# Patient Record
Sex: Female | Born: 1995 | Race: White | Hispanic: No | Marital: Single | Smoking: Former smoker
Health system: Southern US, Community
[De-identification: ages and names within clinical notes are randomized; demographics above are authoritative.]

## PROBLEM LIST (undated history)

## (undated) ENCOUNTER — Inpatient Hospital Stay (HOSPITAL_COMMUNITY): Payer: Self-pay

## (undated) ENCOUNTER — Inpatient Hospital Stay: Admission: EM | Payer: Self-pay | Source: Home / Self Care

## (undated) DIAGNOSIS — Z789 Other specified health status: Secondary | ICD-10-CM

## (undated) DIAGNOSIS — J45909 Unspecified asthma, uncomplicated: Secondary | ICD-10-CM

## (undated) DIAGNOSIS — D561 Beta thalassemia: Secondary | ICD-10-CM

## (undated) HISTORY — PX: NO PAST SURGERIES: SHX2092

---

## 2013-10-18 ENCOUNTER — Encounter: Payer: Self-pay | Admitting: Advanced Practice Midwife

## 2013-10-18 ENCOUNTER — Ambulatory Visit (INDEPENDENT_AMBULATORY_CARE_PROVIDER_SITE_OTHER): Payer: Medicaid Other | Admitting: Advanced Practice Midwife

## 2013-10-18 VITALS — BP 131/86 | Temp 98.1°F | Ht 64.5 in | Wt 181.0 lb

## 2013-10-18 DIAGNOSIS — I10 Essential (primary) hypertension: Secondary | ICD-10-CM

## 2013-10-18 DIAGNOSIS — K5900 Constipation, unspecified: Secondary | ICD-10-CM

## 2013-10-18 DIAGNOSIS — IMO0001 Reserved for inherently not codable concepts without codable children: Secondary | ICD-10-CM

## 2013-10-18 DIAGNOSIS — Z3400 Encounter for supervision of normal first pregnancy, unspecified trimester: Secondary | ICD-10-CM | POA: Insufficient documentation

## 2013-10-18 DIAGNOSIS — Z113 Encounter for screening for infections with a predominantly sexual mode of transmission: Secondary | ICD-10-CM

## 2013-10-18 DIAGNOSIS — Z3201 Encounter for pregnancy test, result positive: Secondary | ICD-10-CM

## 2013-10-18 LAB — POCT URINALYSIS DIPSTICK
Bilirubin, UA: NEGATIVE
Blood, UA: NEGATIVE
GLUCOSE UA: NEGATIVE
Ketones, UA: NEGATIVE
LEUKOCYTES UA: NEGATIVE
NITRITE UA: NEGATIVE
Protein, UA: NEGATIVE
Spec Grav, UA: 1.025
UROBILINOGEN UA: NEGATIVE
pH, UA: 5

## 2013-10-18 LAB — HIV ANTIBODY (ROUTINE TESTING W REFLEX): HIV: NONREACTIVE

## 2013-10-18 MED ORDER — NEXA PLUS 29-1.25-350 MG PO CAPS: 1.0000 | ORAL_CAPSULE | Freq: Every day | ORAL | Status: DC

## 2013-10-18 NOTE — Progress Notes (Signed)
Subjective:    Kristin Vang is being seen today for her first obstetrical visit.  This is not a planned pregnancy. She is at [redacted]w[redacted]d gestation. Her obstetrical history is significant for this is patients first pregnancy . Relationship with FOB: significant other, living together. Patient does intend to breast feed. Pregnancy history fully reviewed. Pulse: 130 Patient states she is having constipation. Patient states she was smoking Marijuana before she found out she was pregnant but that she stopped smoking as soon as she found out.  Menstrual History: OB History   Grav Para Term Preterm Abortions TAB SAB Ect Mult Living   1               Menarche age: 56  Patient's last menstrual period was 07/29/2013.    The following portions of the patient's history were reviewed and updated as appropriate: allergies, current medications, past family history, past medical history, past social history, past surgical history and problem list.  Review of Systems A comprehensive review of systems was negative except for: Gastrointestinal: positive for constipation    Objective:    BP 131/86  Temp(Src) 98.1 F (36.7 C)  Ht 5' 4.5" (1.638 m)  Wt 181 lb (82.101 kg)  BMI 30.60 kg/m2  LMP 07/29/2013  General Appearance:    Alert, cooperative, no distress, appears stated age  Head:    Normocephalic, without obvious abnormality, atraumatic  Eyes:    PERRL, conjunctiva/corneas clear, EOM's intact, fundi    benign, both eyes  Ears:    Normal TM's and external ear canals, both ears  Nose:   Nares normal, septum midline, mucosa normal, no drainage    or sinus tenderness  Throat:   Lips, mucosa, and tongue normal; teeth and gums normal  Neck:   Supple, symmetrical, trachea midline, no adenopathy;    thyroid:  no enlargement/tenderness/nodules; no carotid   bruit or JVD  Back:     Symmetric, no curvature, ROM normal, no CVA tenderness  Lungs:     Clear to auscultation bilaterally, respirations unlabored   Chest Wall:    No tenderness or deformity   Heart:    Regular rate and rhythm, S1 and S2 normal, no murmur, rub   or gallop     Abdomen:     Soft, non-tender, bowel sounds active all four quadrants,    no masses, no organomegaly        Extremities:   Extremities normal, atraumatic, no cyanosis or edema  Pulses:   2+ and symmetric all extremities  Skin:   Skin color, texture, turgor normal, no rashes or lesions  Lymph nodes:   Cervical, supraclavicular, and axillary nodes normal  Neurologic:   CNII-XII intact, normal strength, sensation and reflexes    throughout      Assessment:    Pregnancy at [redacted]w[redacted]d weeks  Patient Active Problem List   Diagnosis Date Noted  . Constipation 10/18/2013  . Supervision of normal first pregnancy 10/18/2013  . White coat hypertension 10/18/2013      Plan:    Initial labs drawn. Prenatal vitamins.  Counseling provided regarding continued use of seat belts, cessation of alcohol consumption, smoking or use of illicit drugs; infection precautions i.e., influenza/TDAP immunizations, toxoplasmosis,CMV, parvovirus, listeria and varicella; workplace safety, exercise during pregnancy; routine dental care, safe medications, sexual activity, hot tubs, saunas, pools, travel, caffeine use, fish and methlymercury, potential toxins, hair treatments, varicose veins Weight gain recommendations per IOM guidelines reviewed: underweight/BMI< 18.5--> gain 28 - 40 lbs; normal weight/BMI  18.5 - 24.9--> gain 25 - 35 lbs; overweight/BMI 25 - 29.9--> gain 15 - 25 lbs; obese/BMI >30->gain  11 - 20 lbs Problem list reviewed and updated. FIRST/CF mutation testing/NIPT/QUAD SCREEN discussed: offer NV. Role of ultrasound in pregnancy discussed; fetal survey: plan between 18-20 weeks. Amniocentesis discussed: not indicated. VBAC calculator score: VBAC consent form provided Follow up in 4 weeks. Reviewed methods to help prevent constipation. 80% of 40 min visit spent on  counseling and coordination of care.   Kristin Vang CNM

## 2013-10-19 LAB — OBSTETRIC PANEL
Antibody Screen: NEGATIVE
BASOS PCT: 0 % (ref 0–1)
Basophils Absolute: 0 10*3/uL (ref 0.0–0.1)
EOS ABS: 0.1 10*3/uL (ref 0.0–0.7)
EOS PCT: 1 % (ref 0–5)
HCT: 33.8 % — ABNORMAL LOW (ref 36.0–46.0)
Hemoglobin: 11.2 g/dL — ABNORMAL LOW (ref 12.0–15.0)
Hepatitis B Surface Ag: NEGATIVE
LYMPHS ABS: 2.3 10*3/uL (ref 0.7–4.0)
Lymphocytes Relative: 21 % (ref 12–46)
MCH: 21.7 pg — AB (ref 26.0–34.0)
MCHC: 33.1 g/dL (ref 30.0–36.0)
MCV: 65.4 fL — AB (ref 78.0–100.0)
MONOS PCT: 4 % (ref 3–12)
Monocytes Absolute: 0.4 10*3/uL (ref 0.1–1.0)
Neutro Abs: 8.2 10*3/uL — ABNORMAL HIGH (ref 1.7–7.7)
Neutrophils Relative %: 74 % (ref 43–77)
PLATELETS: 293 10*3/uL (ref 150–400)
RBC: 5.17 MIL/uL — AB (ref 3.87–5.11)
RDW: 15.6 % — ABNORMAL HIGH (ref 11.5–15.5)
RH TYPE: POSITIVE
Rubella: 1.56 Index — ABNORMAL HIGH (ref <0.90)
WBC: 11.1 10*3/uL — ABNORMAL HIGH (ref 4.0–10.5)

## 2013-10-19 LAB — VARICELLA ZOSTER ANTIBODY, IGG: Varicella IgG: 133.6 Index (ref <135.00)

## 2013-10-19 LAB — GC/CHLAMYDIA PROBE AMP
CT Probe RNA: NEGATIVE
GC Probe RNA: NEGATIVE

## 2013-10-19 LAB — CULTURE, OB URINE
Colony Count: NO GROWTH
ORGANISM ID, BACTERIA: NO GROWTH

## 2013-10-19 LAB — VITAMIN D 25 HYDROXY (VIT D DEFICIENCY, FRACTURES): Vit D, 25-Hydroxy: 24 ng/mL — ABNORMAL LOW (ref 30–89)

## 2013-10-21 LAB — HEMOGLOBINOPATHY EVALUATION
HGB A: 95.5 % — AB (ref 96.8–97.8)
Hemoglobin Other: 0 %
Hgb A2 Quant: 4.5 % — ABNORMAL HIGH (ref 2.2–3.2)
Hgb F Quant: 0 % (ref 0.0–2.0)
Hgb S Quant: 0 %

## 2013-11-04 ENCOUNTER — Encounter: Payer: Self-pay | Admitting: Advanced Practice Midwife

## 2013-11-04 DIAGNOSIS — Z3400 Encounter for supervision of normal first pregnancy, unspecified trimester: Secondary | ICD-10-CM | POA: Insufficient documentation

## 2013-11-15 ENCOUNTER — Ambulatory Visit (INDEPENDENT_AMBULATORY_CARE_PROVIDER_SITE_OTHER): Payer: Medicaid Other | Admitting: Advanced Practice Midwife

## 2013-11-15 VITALS — BP 125/87 | Temp 98.0°F | Wt 179.0 lb

## 2013-11-15 DIAGNOSIS — Z3400 Encounter for supervision of normal first pregnancy, unspecified trimester: Secondary | ICD-10-CM

## 2013-11-15 LAB — POCT URINALYSIS DIPSTICK
Bilirubin, UA: NEGATIVE
Blood, UA: NEGATIVE
Glucose, UA: NEGATIVE
KETONES UA: NEGATIVE
Nitrite, UA: NEGATIVE
Protein, UA: NEGATIVE
Spec Grav, UA: 1.015
UROBILINOGEN UA: NEGATIVE
pH, UA: 7

## 2013-11-15 NOTE — Progress Notes (Signed)
Subjective: Kristin Vang is a 18 y.o. at 15 weeks by LMP  Patient denies vaginal leaking of fluid or bleeding, denies contractions.  Reports positive fetal movment.  Denies concerns today. Patient denies any known thalassemia or family history.  Objective: Filed Vitals:   11/15/13 1359  BP: 125/87  Temp: 98 F (36.7 C)   160 FHR 1/2 U and SP Fundal Height Fetal Position NA  Assessment: Patient Active Problem List   Diagnosis Date Noted  . Supervision of normal first pregnancy 11/04/2013  . Constipation 10/18/2013  . Supervision of normal first pregnancy 10/18/2013  . White coat hypertension 10/18/2013  Potential beta thalassemia trait  Plan: Patient to return to clinic in 4 weeks Genetic Consult Reviewed warning signs in pregnancy. Patient to call with concerns PRN. Reviewed triage location. Reviewed vit D deficiency.  20 min spent with patient greater than 80% spent in counseling and coordination of care.   Mattthew Ziomek Wilson SingerWren CNM

## 2013-11-15 NOTE — Progress Notes (Signed)
Pulse: 118 Patient denies any concerns.  

## 2013-11-16 ENCOUNTER — Encounter (HOSPITAL_COMMUNITY): Payer: Self-pay | Admitting: Advanced Practice Midwife

## 2013-11-16 ENCOUNTER — Other Ambulatory Visit: Payer: Self-pay | Admitting: Advanced Practice Midwife

## 2013-11-16 DIAGNOSIS — Z3400 Encounter for supervision of normal first pregnancy, unspecified trimester: Secondary | ICD-10-CM | POA: Insufficient documentation

## 2013-11-16 LAB — AFP, QUAD SCREEN
AFP: 24.7 [IU]/mL
Age Alone: 1:1190 {titer}
CURR GEST AGE: 15.4 wks.days
Down Syndrome Scr Risk Est: 1:295 {titer}
HCG, Total: 45620 m[IU]/mL
INH: 269 pg/mL
INTERPRETATION-AFP: NEGATIVE
MOM FOR AFP: 0.97
MoM for INH: 1.65
MoM for hCG: 1.88
Open Spina bifida: NEGATIVE
Tri 18 Scr Risk Est: NEGATIVE
Trisomy 18 (Edward) Syndrome Interp.: 1:15000 {titer}
UE3 MOM: 0.53
uE3 Value: 0.2 ng/mL

## 2013-11-23 ENCOUNTER — Ambulatory Visit (HOSPITAL_COMMUNITY): Admission: RE | Admit: 2013-11-23 | Payer: Self-pay | Source: Ambulatory Visit

## 2013-12-05 ENCOUNTER — Other Ambulatory Visit: Payer: Self-pay | Admitting: *Deleted

## 2013-12-05 DIAGNOSIS — Z1389 Encounter for screening for other disorder: Secondary | ICD-10-CM

## 2013-12-13 ENCOUNTER — Other Ambulatory Visit: Payer: Self-pay

## 2013-12-13 ENCOUNTER — Encounter: Payer: Self-pay | Admitting: Advanced Practice Midwife

## 2013-12-13 ENCOUNTER — Ambulatory Visit (INDEPENDENT_AMBULATORY_CARE_PROVIDER_SITE_OTHER): Payer: Medicaid Other

## 2013-12-13 ENCOUNTER — Ambulatory Visit (INDEPENDENT_AMBULATORY_CARE_PROVIDER_SITE_OTHER): Payer: Medicaid Other | Admitting: Advanced Practice Midwife

## 2013-12-13 VITALS — BP 126/85 | HR 110 | Temp 97.8°F | Wt 180.0 lb

## 2013-12-13 DIAGNOSIS — O283 Abnormal ultrasonic finding on antenatal screening of mother: Secondary | ICD-10-CM | POA: Insufficient documentation

## 2013-12-13 DIAGNOSIS — Z1389 Encounter for screening for other disorder: Secondary | ICD-10-CM

## 2013-12-13 DIAGNOSIS — Z3400 Encounter for supervision of normal first pregnancy, unspecified trimester: Secondary | ICD-10-CM

## 2013-12-13 DIAGNOSIS — O289 Unspecified abnormal findings on antenatal screening of mother: Secondary | ICD-10-CM

## 2013-12-13 LAB — US OB COMP + 14 WK

## 2013-12-13 NOTE — Progress Notes (Addendum)
Subjective: Kristin Vang is a 18 y.o. at 19 weeks by LMP  Patient denies vaginal leaking of fluid or bleeding, denies contractions.  Reports positive fetal movment.  Denies concerns today. Had ultrasound today, expecting a girl. Planning to name her Kristin Vang.  Objective: Filed Vitals:   12/13/13 1343  BP: 126/85  Pulse: 110  Temp: 97.8 F (36.6 C)   150 FHR @U  Fundal Height   Assessment: Patient Active Problem List   Diagnosis Date Noted  . 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 Supervision of normal first pregnancy 11/04/2013  . Constipation 10/18/2013  . Supervision of normal first pregnancy 10/18/2013  . White coat hypertension 10/18/2013    Plan: Patient to return to clinic in 4 weeks Review US NV Sent patient for Genetic Consult, missed last appt (change in phone number). Reviewed traveling education.  Glucose test NV Reviewed warning signs in pregnancy. Patient to call with concerns PRN. Reviewed triage location.  15 min spent with patient greater than 80% spent in counseling and coordination of care.   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 MFM referral for US placed. MFM will try to get patient in after genetic consult visit tomorrow. Patient notified.

## 2013-12-13 NOTE — Addendum Note (Signed)
Addended by: Glendell DockerKNIGHT, Firman Petrow on: 12/13/2013 03:42 PM   Modules accepted: Orders

## 2013-12-13 NOTE — Addendum Note (Signed)
Addended byWilson Singer: Swayze Pries H on: 12/13/2013 04:00 PM   Modules accepted: Orders

## 2013-12-14 ENCOUNTER — Ambulatory Visit (HOSPITAL_COMMUNITY)
Admission: RE | Admit: 2013-12-14 | Discharge: 2013-12-14 | Disposition: A | Discharge status: Home / Self Care | Payer: Medicaid Other | Source: Ambulatory Visit | Attending: Advanced Practice Midwife | Admitting: Advanced Practice Midwife

## 2013-12-14 DIAGNOSIS — O283 Abnormal ultrasonic finding on antenatal screening of mother: Secondary | ICD-10-CM

## 2013-12-14 DIAGNOSIS — Z3400 Encounter for supervision of normal first pregnancy, unspecified trimester: Secondary | ICD-10-CM

## 2013-12-14 DIAGNOSIS — D7589 Other specified diseases of blood and blood-forming organs: Secondary | ICD-10-CM | POA: Insufficient documentation

## 2013-12-14 DIAGNOSIS — IMO0002 Reserved for concepts with insufficient information to code with codable children: Secondary | ICD-10-CM | POA: Insufficient documentation

## 2013-12-14 DIAGNOSIS — K5900 Constipation, unspecified: Secondary | ICD-10-CM

## 2013-12-14 DIAGNOSIS — IMO0001 Reserved for inherently not codable concepts without codable children: Secondary | ICD-10-CM

## 2013-12-14 LAB — CHROMOSOMES ANALYSIS FOR CF

## 2013-12-14 NOTE — Progress Notes (Signed)
Genetic Counseling  Visit Summary Note  Appointment Date: 12/14/2013 Referred By: Waynetta Pean, CNM  Date of Birth: 1995/12/31  Pregnancy history: G1P0 Estimated Date of Delivery: 05/05/14 Estimated Gestational Age: [redacted]w[redacted]d  I met with Ms. Kristin Kivettfor genetic counseling because she had an abnormal hemoglobin Vang.  She was accompanied to the visit by her best friend.  Ms. KLabreckhad routine screening for hemoglobinopathies via hemoglobin Vang and complete blood count.  This revealed a hemoglobin A of 95.5%, hemoglobin A2 of 4.5%, and hemoglobin F of 0%.  Her MCV value was 65.4.  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  We discussed that the thalassemias, collectively, are the most common human single gene disorders.  They are a heterogenous group of diseases of hemoglobin synthesis in which mutations reduce the synthesis or stability of either the alpha or beta globin chain to cause alpha-thalassemia or beta-thalassemia respectively.  The resulting imbalance in the ratio of the alpha chains to the beta chains causes the underlying pathology.  In beta-thalassemia, the alpha chains are produced in relative excess.  Therefore, because of the absence of the beta chains with which to form a tetramer, the excess alpha chains will precipitate in the cell damaging the membrane and leading to premature RBC destruction.  This results in hypochromic, microcytic anemia. Because the beta chain is not used for hemoglobin production during fetal life, the onset of symptoms in beta-thalassemia is not until a few months of life.  Elevation of hemoglobin A2 (alpha2/delta2) occurs uniquely in beta-thalassemia carriers, and this is what was found on Ms. Kristin Vang.  Continued production of  the delta chains allows for tetramer formation between the alpha and delta chains.  Beta-thalassemia produces a variable phenotype dependent upon the severity of the mutations.  Beta-thalassemia is inherited in an autosomal recessive manner.  For this reason we reviewed autosomal recessive inheritance.  We discussed that when one parent is a carrier for beta-thalassemia, if the other parent is a carrier for any beta chain hemoglobin variant (HgS, HgC, etc) there would be a risk for a clinically significant hemoglobinopathy in their children.  At this time, the father of the baby, Kristin Vang has not had hemoglobin Vang to determine if he carries a hemoglobin variant.  Ms. KOlsenstated that she will ask him to have testing performed.  We discussed that it was important that Kristin Vang a quantitative hemoglobin Vang to identify any hemoglobin variant that could interact with her beta-thalassemia mutation.   Ms. Kristin Vang counseled about the 1 in 4 (25%) chance with each pregnancy to have a child with a clinically significant hemoglobin variant if she and Kristin Vang both carriers.   There is also a 1 in 4 chance to have a child who is not a carrier, or affected; and a 1 in 2 chance to have a child who is a carrier.  We reviewed that early diagnosis of hemoglobinopathies is facilitated by newborn screening before the onset of symptoms.  We discussed the option of amniocentesis as a way to determine, prenatally, if a baby has a hemoglobinopathy or not, when both parents are known carriers.  This would only be an option if Kristin Vang testing and was found to be a carrier. She understands that a baby with a hemoglobinopathy would not appear different on a prenatal ultrasound.  The family histories were otherwise found to be noncontributory for birth defects, mental retardation, and known genetic conditions. Without further information regarding the provided family history, an accurate genetic  risk cannot be calculated. Further genetic counseling is warranted if more information is obtained.  Ms. Kristin Vang had her anatomy ultrasound last week at her primary obs office.  At that time it was noted that there was an echogenic bowel.  We discussed that the second trimester genetic sonogram is targeted at identifying features associated with aneuploidy.  It has evolved as a screening tool used to provide an individualized risk assessment for Down syndrome and other trisomies.  The ability of sonography to aid in the detection of aneuploidies relies on identification of both major structural anomalies and soft markers.  The patient was counseled that the latter term refers to findings that are often normal variants and do not cause any significant medical problems.  Nonetheless, these markers have a known association with aneuploidy.  The patient was counseled that bowel is defined as being echogenic if it is as bright as adjacent bone.  Echogenic bowel is present in ~0.6-2.4% of normal fetuses, but is associated with a six fold increase in risk for aneuploidy.  In addition, it is associated with a 2% risk for fetal cystic fibrosis, 3% risk for a congenital infection, and a 3% risk for a bleed.  Given Ms. Kristin Vang screen, we discussed that her a priori risk for fetal Down syndrome is 1 in 81.  Considering the ultrasound finding of echogenic bowel, the adjusted risk for fetal Down syndrome is approximately 1 in 50 (2%).   We reviewed chromosomes, nondisjunction, and the common features and variable prognosis of Down syndrome.  We also reviewed other aneuploidies including trisomies 63 and 30; however, Ms. Kristin Vang was counseled that these conditions are less likely given that the remainder of the fetal anatomy was normal by ultrasound.  We reviewed the option of noninvasive prenatal screening (NIPS).  She was counseled that screening tests are used to modify a patient's a priori risk for aneuploidy,  typically based on age.  This estimate provides a pregnancy specific risk assessment.  We reviewed the conditions for which the test screens, as well as the detection rates, and false positive rates.  She was also counseled regarding diagnostic testing via amniocentesis.  We reviewed the approximate 1 in 122-449 risk for complications for amniocentesis, including spontaneous pregnancy loss. After consideration of all the options, she elected to proceed with NIPS.  Results will be available in 8-10 business days. She also elected to have infectious titers drawn, as well as carrier testing for cystic fibrosis.  Ms. Kristin Vang denied exposure to environmental toxins or chemical agents. She denied the use of alcohol, tobacco or street drugs. She denied significant viral illnesses during the course of her pregnancy. Her medical and surgical histories were noncontributory.   I counseled Ms. Kristin Vang regarding the above risks and available options.  The approximate face-to-face time with the genetic counselor was 45 minutes.  Cam Hai, MS Certified Genetic Counselor

## 2013-12-15 ENCOUNTER — Other Ambulatory Visit: Payer: Self-pay

## 2013-12-15 ENCOUNTER — Other Ambulatory Visit: Payer: Self-pay | Admitting: Advanced Practice Midwife

## 2013-12-15 DIAGNOSIS — O352XX0 Maternal care for (suspected) hereditary disease in fetus, not applicable or unspecified: Secondary | ICD-10-CM

## 2013-12-15 DIAGNOSIS — O289 Unspecified abnormal findings on antenatal screening of mother: Secondary | ICD-10-CM

## 2013-12-15 DIAGNOSIS — Z3689 Encounter for other specified antenatal screening: Secondary | ICD-10-CM

## 2013-12-15 LAB — CMV ANTIBODY, IGG (EIA): CMV Ab - IgG: <0.2 U/mL (ref <0.60)

## 2013-12-15 LAB — CMV IGM: CMV IgM: <8 AU/mL (ref <30.00)

## 2013-12-15 LAB — TOXOPLASMA ANTIBODIES- IGG AND  IGM: Toxoplasma IgG Ratio: <3 IU/mL (ref <7.2)

## 2013-12-15 LAB — HSV(HERPES SMPLX)ABS-I+II(IGG+IGM)-BLD
HERPES SIMPLEX VRS I-IGM AB (EIA): 0.77 {index}
HSV 1 Glycoprotein G Ab, IgG: 0.23 IV
HSV 2 Glycoprotein G Ab, IgG: <0.1 IV

## 2013-12-16 ENCOUNTER — Encounter: Payer: Self-pay | Admitting: Advanced Practice Midwife

## 2013-12-16 LAB — US OB COMP + 14 WK

## 2013-12-17 LAB — PARVOVIRUS B19 ANTIBODY, IGG AND IGM
PAROVIRUS B19 IGM ABS: 0.1 {index} (ref <0.9)
Parovirus B19 IgG Abs: 0.2 index (ref <0.9)

## 2013-12-21 ENCOUNTER — Other Ambulatory Visit: Payer: Self-pay | Admitting: Advanced Practice Midwife

## 2013-12-21 ENCOUNTER — Ambulatory Visit (HOSPITAL_COMMUNITY)
Admission: RE | Admit: 2013-12-21 | Discharge: 2013-12-21 | Disposition: A | Discharge status: Home / Self Care | Payer: Medicaid Other | Source: Ambulatory Visit | Attending: Advanced Practice Midwife | Admitting: Advanced Practice Midwife

## 2013-12-21 DIAGNOSIS — O352XX0 Maternal care for (suspected) hereditary disease in fetus, not applicable or unspecified: Secondary | ICD-10-CM | POA: Insufficient documentation

## 2013-12-21 DIAGNOSIS — O358XX0 Maternal care for other (suspected) fetal abnormality and damage, not applicable or unspecified: Secondary | ICD-10-CM

## 2013-12-21 DIAGNOSIS — Z3689 Encounter for other specified antenatal screening: Secondary | ICD-10-CM | POA: Insufficient documentation

## 2013-12-21 DIAGNOSIS — O289 Unspecified abnormal findings on antenatal screening of mother: Secondary | ICD-10-CM | POA: Insufficient documentation

## 2013-12-22 ENCOUNTER — Telehealth (HOSPITAL_COMMUNITY): Payer: Self-pay | Admitting: Obstetrics and Gynecology

## 2013-12-22 ENCOUNTER — Encounter: Payer: Self-pay | Admitting: Advanced Practice Midwife

## 2013-12-22 NOTE — Telephone Encounter (Signed)
Called Kristin Vang to discuss her cell free fetal DNA test results.  Mrs. Kristin BendersCarrie Brensinger had [facility] testing through [facility].  Testing was offered because of an echogenic bowel.   The patient was identified by name and DOB.  We reviewed that these are within normal limits, showing a less than 1 in 10,000 risk for trisomies 21, 18 and 13, and monosomy X (Turner syndrome).  In addition, the risk for triploidy/vanishing twin and sex chromosome trisomies (47,XXX and 47,XXY) was also low risk. We reviewed that this testing identifies > 99% of pregnancies with trisomy 5921, trisomy 4313, sex chromosome trisomies (47,XXX and 47,XXY), and triploidy. The detection rate for trisomy 18 is 96%.  The detection rate for monosomy X is ~92%.  The false positive rate is <0.1% for all conditions. Testing was also consistent with female gender.  She understands that this testing does not identify all genetic conditions.  All questions were answered to her satisfaction, she was encouraged to call with additional questions or concerns.  We discussed that her CF testing was still pending and I would call her with that as soon as it was available.  Mady Gemmaaragh Conrad, MS Certified Genetic Counselor

## 2013-12-27 ENCOUNTER — Telehealth (HOSPITAL_COMMUNITY): Payer: Self-pay | Admitting: Obstetrics and Gynecology

## 2013-12-27 NOTE — Telephone Encounter (Signed)
Called Ms. Kristin Vang to review the results of her CF carrier testing.  We discussed that this did not show evidence of her being a carrier for CF.  She understands that carrier testing is able to detect the most common CF gene changes, and thus can reduce, but not eliminate the chance to be a carrier for cystic fibrosis.  All of her questions were answered.  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  She will follow up with me when that has been completed. Mady Gemmaaragh Conrad, MS Certified Genetic Counselor

## 2014-01-10 ENCOUNTER — Ambulatory Visit (INDEPENDENT_AMBULATORY_CARE_PROVIDER_SITE_OTHER): Payer: Medicaid Other | Admitting: Advanced Practice Midwife

## 2014-01-10 ENCOUNTER — Encounter: Payer: Self-pay | Admitting: Advanced Practice Midwife

## 2014-01-10 ENCOUNTER — Other Ambulatory Visit: Payer: Medicaid Other

## 2014-01-10 VITALS — BP 123/79 | HR 128 | Temp 98.3°F | Wt 181.0 lb

## 2014-01-10 DIAGNOSIS — O289 Unspecified abnormal findings on antenatal screening of mother: Secondary | ICD-10-CM

## 2014-01-10 DIAGNOSIS — O283 Abnormal ultrasonic finding on antenatal screening of mother: Secondary | ICD-10-CM

## 2014-01-10 DIAGNOSIS — Z3400 Encounter for supervision of normal first pregnancy, unspecified trimester: Secondary | ICD-10-CM

## 2014-01-10 LAB — CBC
HEMATOCRIT: 30.2 % — AB (ref 36.0–46.0)
Hemoglobin: 9.8 g/dL — ABNORMAL LOW (ref 12.0–15.0)
MCH: 22.1 pg — ABNORMAL LOW (ref 26.0–34.0)
MCHC: 32.5 g/dL (ref 30.0–36.0)
MCV: 68.2 fL — AB (ref 78.0–100.0)
Platelets: 282 10*3/uL (ref 150–400)
RBC: 4.43 MIL/uL (ref 3.87–5.11)
RDW: 16.6 % — ABNORMAL HIGH (ref 11.5–15.5)
WBC: 11 10*3/uL — AB (ref 4.0–10.5)

## 2014-01-10 NOTE — Progress Notes (Signed)
Subjective: Kristin Vang is a 18 y.o. at 23 weeks by LMP  Patient denies vaginal leaking of fluid or bleeding, denies contractions.  Reports positive fetal movment.  Denies concerns today.  Objective: Filed Vitals:   01/10/14 0941  BP: 123/79  Pulse: 128  Temp: 98.3 F (36.8 C)   150 FHR 23 Fundal Height Fetal Position unknown  Assessment: Patient Active Problem List   Diagnosis Date Noted  . 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 Supervision of normal first pregnancy 11/04/2013  . Constipation 10/18/2013  . Supervision of normal first pregnancy 10/18/2013  . White coat hypertension 10/18/2013    Plan: Patient to return to clinic in 4 weeks FOC to have hemoglobin electrophoresis, they are trying to get this done. Repeat US pending Reviewed warning signs in pregnancy. Patient to call with concerns PRN. Reviewed triage location. 2 hour GCT today, review NV   Alejandra Hunt Wilson SingerWren CNM

## 2014-01-10 NOTE — Addendum Note (Signed)
Addended by: Odessa FlemingBOHNE, Trusten Hume M on: 01/10/2014 05:08 PM   Modules accepted: Orders

## 2014-01-11 LAB — GLUCOSE TOLERANCE, 2 HOURS W/ 1HR
GLUCOSE: 158 mg/dL (ref 70–170)
Glucose, 2 hour: 120 mg/dL (ref 70–139)
Glucose, Fasting: 72 mg/dL (ref 70–99)

## 2014-01-11 LAB — HIV ANTIBODY (ROUTINE TESTING W REFLEX): HIV 1&2 Ab, 4th Generation: NONREACTIVE

## 2014-01-11 LAB — RPR

## 2014-01-13 ENCOUNTER — Telehealth: Payer: Self-pay | Admitting: *Deleted

## 2014-01-13 NOTE — Telephone Encounter (Signed)
Patient called regarding lab results. Results reviewed with Dory Horn, CNM. Patient notified of lab results, Patient notified that she should start an Iron rich diet such as green leafy vegetables, red meats, oatmeal and Iron rich cereals. Patient notified that Amy would like to have her come in for a Lab visit for Iron studies. Amy also spoke with Patient. Patient to be scheduled on Tuesday.

## 2014-01-17 ENCOUNTER — Other Ambulatory Visit: Payer: Self-pay | Admitting: Advanced Practice Midwife

## 2014-01-17 DIAGNOSIS — D649 Anemia, unspecified: Secondary | ICD-10-CM

## 2014-01-18 ENCOUNTER — Ambulatory Visit (HOSPITAL_COMMUNITY)
Admission: RE | Admit: 2014-01-18 | Discharge: 2014-01-18 | Disposition: A | Discharge status: Home / Self Care | Payer: Medicaid Other | Source: Ambulatory Visit | Attending: Advanced Practice Midwife | Admitting: Advanced Practice Midwife

## 2014-01-18 VITALS — BP 120/81 | HR 126 | Wt 182.2 lb

## 2014-01-18 DIAGNOSIS — O3500X0 Maternal care for (suspected) central nervous system malformation or damage in fetus, unspecified, not applicable or unspecified: Secondary | ICD-10-CM | POA: Insufficient documentation

## 2014-01-18 DIAGNOSIS — O3507X0 Maternal care for (suspected) central nervous system malformation or damage in fetus, microcephaly, not applicable or unspecified: Secondary | ICD-10-CM

## 2014-01-18 DIAGNOSIS — Z3689 Encounter for other specified antenatal screening: Secondary | ICD-10-CM | POA: Insufficient documentation

## 2014-01-18 DIAGNOSIS — O350XX0 Maternal care for (suspected) central nervous system malformation in fetus, not applicable or unspecified: Secondary | ICD-10-CM | POA: Insufficient documentation

## 2014-01-18 DIAGNOSIS — O358XX0 Maternal care for other (suspected) fetal abnormality and damage, not applicable or unspecified: Secondary | ICD-10-CM | POA: Insufficient documentation

## 2014-01-20 ENCOUNTER — Encounter: Payer: Self-pay | Admitting: Advanced Practice Midwife

## 2014-01-20 DIAGNOSIS — Q02 Microcephaly: Secondary | ICD-10-CM | POA: Insufficient documentation

## 2014-01-24 ENCOUNTER — Other Ambulatory Visit: Payer: Self-pay | Admitting: Advanced Practice Midwife

## 2014-01-24 ENCOUNTER — Other Ambulatory Visit: Payer: Medicaid Other

## 2014-01-24 DIAGNOSIS — D649 Anemia, unspecified: Secondary | ICD-10-CM

## 2014-01-24 LAB — IBC PANEL
%SAT: 16 % — AB (ref 20–55)
TIBC: 338 ug/dL (ref 250–470)
UIBC: 284 ug/dL (ref 125–400)

## 2014-01-24 LAB — IRON: IRON: 54 ug/dL (ref 42–145)

## 2014-01-25 LAB — FERRITIN: Ferritin: 29 ng/mL (ref 10–291)

## 2014-01-31 ENCOUNTER — Encounter: Payer: Self-pay | Admitting: Advanced Practice Midwife

## 2014-02-07 ENCOUNTER — Ambulatory Visit (INDEPENDENT_AMBULATORY_CARE_PROVIDER_SITE_OTHER): Payer: Medicaid Other | Admitting: Advanced Practice Midwife

## 2014-02-07 ENCOUNTER — Encounter: Payer: Self-pay | Admitting: Advanced Practice Midwife

## 2014-02-07 VITALS — BP 123/79 | HR 108 | Temp 98.4°F | Wt 184.0 lb

## 2014-02-07 DIAGNOSIS — O26899 Other specified pregnancy related conditions, unspecified trimester: Secondary | ICD-10-CM

## 2014-02-07 DIAGNOSIS — N949 Unspecified condition associated with female genital organs and menstrual cycle: Secondary | ICD-10-CM

## 2014-02-07 DIAGNOSIS — R102 Pelvic and perineal pain: Secondary | ICD-10-CM

## 2014-02-07 DIAGNOSIS — Z3400 Encounter for supervision of normal first pregnancy, unspecified trimester: Secondary | ICD-10-CM

## 2014-02-07 LAB — POCT URINALYSIS DIPSTICK
Bilirubin, UA: NEGATIVE
Blood, UA: NEGATIVE
Glucose, UA: NEGATIVE
Ketones, UA: NEGATIVE
LEUKOCYTES UA: NEGATIVE
NITRITE UA: NEGATIVE
PH UA: 6
Spec Grav, UA: 1.02
UROBILINOGEN UA: NEGATIVE

## 2014-02-07 NOTE — Progress Notes (Signed)
Subjective: Saddie BendersCarrie Cuellar is a 18 y.o. at 27 weeks by LMP  Patient denies vaginal leaking of fluid or bleeding, denies contractions.  Reports positive fetal movment.  Denies concerns today.  Objective: Filed Vitals:   02/07/14 0941  BP: 123/79  Pulse: 108  Temp: 98.4 F (36.9 C)   140 FHR 26 Fundal Height Fetal Position uncertain  Assessment: Patient Active Problem List   Diagnosis Date Noted  . Microcephaly 01/20/2014  . 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 Supervision of normal first pregnancy 11/04/2013  . Constipation 10/18/2013  . White coat hypertension 10/18/2013    Plan: Patient to return to clinic in 2 weeks Repeat US scheduled June 25th (review NV) Reviewed warning signs in pregnancy. Patient to call with concerns PRN. Reviewed triage location. Reviewed iron studies, borderline. Reviewed iron rich diet with the patient at this time.  Amy Wilson SingerWren CNM

## 2014-02-15 ENCOUNTER — Ambulatory Visit (HOSPITAL_COMMUNITY)
Admission: RE | Admit: 2014-02-15 | Discharge: 2014-02-15 | Disposition: A | Discharge status: Home / Self Care | Payer: Medicaid Other | Source: Ambulatory Visit | Attending: Advanced Practice Midwife | Admitting: Advanced Practice Midwife

## 2014-02-15 ENCOUNTER — Encounter (HOSPITAL_COMMUNITY): Payer: Self-pay

## 2014-02-15 VITALS — BP 121/76 | HR 125 | Wt 185.0 lb

## 2014-02-15 DIAGNOSIS — Z3689 Encounter for other specified antenatal screening: Secondary | ICD-10-CM | POA: Insufficient documentation

## 2014-02-15 DIAGNOSIS — O283 Abnormal ultrasonic finding on antenatal screening of mother: Secondary | ICD-10-CM

## 2014-02-15 DIAGNOSIS — IMO0001 Reserved for inherently not codable concepts without codable children: Secondary | ICD-10-CM

## 2014-02-15 DIAGNOSIS — O350XX0 Maternal care for (suspected) central nervous system malformation in fetus, not applicable or unspecified: Secondary | ICD-10-CM | POA: Insufficient documentation

## 2014-02-15 DIAGNOSIS — O3507X0 Maternal care for (suspected) central nervous system malformation or damage in fetus, microcephaly, not applicable or unspecified: Secondary | ICD-10-CM

## 2014-02-15 DIAGNOSIS — Q02 Microcephaly: Secondary | ICD-10-CM

## 2014-02-15 DIAGNOSIS — O3500X0 Maternal care for (suspected) central nervous system malformation or damage in fetus, unspecified, not applicable or unspecified: Secondary | ICD-10-CM | POA: Insufficient documentation

## 2014-02-15 NOTE — Progress Notes (Signed)
[facility] ultrasound  Indication: 18 yr old G1P0 at 5178w5d with previous finding of echogenic bowel and microcephaly for follow up ultrasound.  Findings: 1. Single intrauterine pregnancy. 2. Estimated fetal weight is in the 33rd%. The head circumference is in the <3rd%. 3. Anterior placenta without evidence of previa. 4. Normal amniotic fluid index. 5. Normal transabdominal cervical length. 6. The limited anatomy survey is normal.  Recommendations: 1. Overall appropriate fetal growth. 2. Microcephaly: - previously counseled - head circumference is at 2 SD below the mean - negative TORCH work up - normal cell free fetal DNA - discussed association with increased risk of learning disability - intracranial structure appear normal 3. Previous finding of echogenic bowel: - previously counseled - work up as above - cystic fibrosis screen negative - recommend follow fetal growth every 4 weeks  Eulis FosterKristen Quinn, MD

## 2014-02-21 ENCOUNTER — Encounter: Payer: Medicaid Other | Admitting: Advanced Practice Midwife

## 2014-02-27 ENCOUNTER — Ambulatory Visit (INDEPENDENT_AMBULATORY_CARE_PROVIDER_SITE_OTHER): Payer: Medicaid Other | Admitting: Obstetrics & Gynecology

## 2014-02-27 ENCOUNTER — Encounter: Payer: Self-pay | Admitting: Obstetrics & Gynecology

## 2014-02-27 VITALS — BP 126/75 | HR 115 | Temp 99.3°F | Wt 184.0 lb

## 2014-02-27 DIAGNOSIS — Z3403 Encounter for supervision of normal first pregnancy, third trimester: Secondary | ICD-10-CM

## 2014-02-27 DIAGNOSIS — Z3400 Encounter for supervision of normal first pregnancy, unspecified trimester: Secondary | ICD-10-CM

## 2014-02-27 LAB — POCT URINALYSIS DIPSTICK
Bilirubin, UA: NEGATIVE
Blood, UA: NEGATIVE
Glucose, UA: NEGATIVE
Ketones, UA: NEGATIVE
NITRITE UA: NEGATIVE
PH UA: 5.5
Spec Grav, UA: 1.025
UROBILINOGEN UA: 1

## 2014-02-27 NOTE — Patient Instructions (Signed)
Tetanus, Diphtheria, Pertussis (Tdap) Vaccine  What You Need to Know  WHY GET VACCINATED?  Tetanus, diphtheria and pertussis can be very serious diseases, even for adolescents and adults. Tdap vaccine can protect us from these diseases.  TETANUS (Lockjaw) causes painful muscle tightening and stiffness, usually all over the body.  · It can lead to tightening of muscles in the head and neck so you can't open your mouth, swallow, or sometimes even breathe. Tetanus kills about 1 out of 5 people who are infected.  DIPHTHERIA can cause a thick coating to form in the back of the throat.  · It can lead to breathing problems, paralysis, heart failure, and death.  PERTUSSIS (Whooping Cough) causes severe coughing spells, which can cause difficulty breathing, vomiting and disturbed sleep.  · It can also lead to weight loss, incontinence, and rib fractures. Up to 2 in 100 adolescents and 5 in 100 adults with pertussis are hospitalized or have complications, which could include pneumonia and death.  These diseases are caused by bacteria. Diphtheria and pertussis are spread from person to person through coughing or sneezing. Tetanus enters the body through cuts, scratches, or wounds.  Before vaccines, the [place] saw as many as 200,000 cases a year of diphtheria and pertussis, and hundreds of cases of tetanus. Since vaccination began, tetanus and diphtheria have dropped by about 99% and pertussis by about 80%.  TDAP VACCINE  Tdap vaccine can protect adolescents and adults from tetanus, diphtheria, and pertussis. One dose of Tdap is routinely given at age 11 or 12. People who did not get Tdap at that age should get it as soon as possible.  Tdap is especially important for health care professionals and anyone having close contact with a baby younger than 12 months.  Pregnant women should get a dose of Tdap during every pregnancy, to protect the newborn from pertussis. Infants are most at risk for severe, life-threatening  complications from pertussis.  A similar vaccine, called Td, protects from tetanus and diphtheria, but not pertussis. A Td booster should be given every 10 years. Tdap may be given as one of these boosters if you have not already gotten a dose. Tdap may also be given after a severe cut or burn to prevent tetanus infection.  Your doctor can give you more information.  Tdap may safely be given at the same time as other vaccines.  SOME PEOPLE SHOULD NOT GET THIS VACCINE  · If you ever had a life-threatening allergic reaction after a dose of any tetanus, diphtheria, or pertussis containing vaccine, OR if you have a severe allergy to any part of this vaccine, you should not get Tdap. Tell your doctor if you have any severe allergies.  · If you had a coma, or long or multiple seizures within 7 days after a childhood dose of DTP or DTaP, you should not get Tdap, unless a cause other than the vaccine was found. You can still get Td.  · Talk to your doctor if you:  ¨ have epilepsy or another nervous system problem,  ¨ had severe pain or swelling after any vaccine containing diphtheria, tetanus or pertussis,  ¨ ever had Guillain-Barré Syndrome (GBS),  ¨ aren't feeling well on the day the shot is scheduled.  RISKS OF A VACCINE REACTION  With any medicine, including vaccines, there is a chance of side effects. These are usually mild and go away on their own, but serious reactions are also possible.  Brief fainting spells can follow   a vaccination, leading to injuries from falling. Sitting or lying down for about 15 minutes can help prevent these. Tell your doctor if you feel dizzy or light-headed, or have vision changes or ringing in the ears.  Mild problems following Tdap (Did not interfere with activities)  · Pain where the shot was given (about 3 in 4 adolescents or 2 in 3 adults)  · Redness or swelling where the shot was given (about 1 person in 5)  · Mild fever of at least 100.4°F (up to about 1 in 25 adolescents or 1 in  100 adults)  · Headache (about 3 or 4 people in 10)  · Tiredness (about 1 person in 3 or 4)  · Nausea, vomiting, diarrhea, stomach ache (up to 1 in 4 adolescents or 1 in 10 adults)  · Chills, body aches, sore joints, rash, swollen glands (uncommon)  Moderate problems following Tdap (Interfered with activities, but did not require medical attention)  · Pain where the shot was given (about 1 in 5 adolescents or 1 in 100 adults)  · Redness or swelling where the shot was given (up to about 1 in 16 adolescents or 1 in 25 adults)  · Fever over 102°F (about 1 in 100 adolescents or 1 in 250 adults)  · Headache (about 3 in 20 adolescents or 1 in 10 adults)  · Nausea, vomiting, diarrhea, stomach ache (up to 1 or 3 people in 100)  · Swelling of the entire arm where the shot was given (up to about 3 in 100).  Severe problems following Tdap (Unable to perform usual activities, required medical attention)  · Swelling, severe pain, bleeding and redness in the arm where the shot was given (rare).  A severe allergic reaction could occur after any vaccine (estimated less than 1 in a million doses).  WHAT IF THERE IS A SERIOUS REACTION?  What should I look for?  · Look for anything that concerns you, such as signs of a severe allergic reaction, very high fever, or behavior changes.  Signs of a severe allergic reaction can include hives, swelling of the face and throat, difficulty breathing, a fast heartbeat, dizziness, and weakness. These would start a few minutes to a few hours after the vaccination.  What should I do?  · If you think it is a severe allergic reaction or other emergency that can't wait, call 9-1-1 or get the person to the nearest hospital. Otherwise, call your doctor.  · Afterward, the reaction should be reported to the "Vaccine Adverse Event Reporting System" (VAERS). Your doctor might file this report, or you can do it yourself through the VAERS web site at www.vaers.hhs.gov, or by calling 1-800-822-7967.  VAERS is  only for reporting reactions. They do not give medical advice.   THE NATIONAL VACCINE INJURY COMPENSATION PROGRAM  The National Vaccine Injury Compensation Program (VICP) is a federal program that was created to compensate people who may have been injured by certain vaccines.  Persons who believe they may have been injured by a vaccine can learn about the program and about filing a claim by calling 1-800-338-2382 or visiting the VICP website at www.hrsa.gov/vaccinecompensation.  HOW CAN I LEARN MORE?  · Ask your doctor.  · Call your local or state health department.  · Contact the Centers for Disease Control and Prevention (CDC):  ¨ Call 1-800-232-4636 or visit CDC's website at www.cdc.gov/vaccines.  CDC Tdap Vaccine VIS (01/01/12)  Document Released: 02/10/2012 Document Revised: 12/06/2012 Document Reviewed: 12/01/2012  ExitCare®   Patient Information ©2015 [employer]. This information is not intended to replace advice given to you by your health care provider. Make sure you discuss any questions you have with your health care provider.

## 2014-02-27 NOTE — Progress Notes (Signed)
Subjective:    Kristin Vang is a 18 y.o. female being seen today for her obstetrical visit. She is at 3287w3d gestation. Patient reports no complaints. Fetal movement: normal.  Problem List Items Addressed This Visit   Supervision of normal first pregnancy - Primary   Relevant Orders      POCT urinalysis dipstick     Patient Active Problem List   Diagnosis Date Noted  . Microcephaly 01/20/2014  . 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 Supervision of normal first pregnancy 11/04/2013  . Constipation 10/18/2013  . White coat hypertension 10/18/2013   Objective:    BP 126/75  Pulse 115  Temp(Src) 99.3 F (37.4 C)  Wt 83.462 kg (184 lb)  LMP 07/29/2013 FHT:  140 BPM  Uterine Size: size equals dates  Presentation: cephalic     Assessment:    Pregnancy @ 3987w3d weeks   Plan:     labs reviewed, problem list updated Encouraged FOB to have Hgb electrophoresis performed TDAP offered   Orders Placed This Encounter  Procedures  . POCT urinalysis dipstick   Follow up in 2 Weeks.

## 2014-03-13 ENCOUNTER — Encounter: Payer: Self-pay | Admitting: Obstetrics & Gynecology

## 2014-03-13 ENCOUNTER — Ambulatory Visit (INDEPENDENT_AMBULATORY_CARE_PROVIDER_SITE_OTHER): Payer: Medicaid Other | Admitting: Obstetrics & Gynecology

## 2014-03-13 VITALS — BP 109/76 | HR 99 | Temp 98.5°F | Wt 188.0 lb

## 2014-03-13 DIAGNOSIS — Z3400 Encounter for supervision of normal first pregnancy, unspecified trimester: Secondary | ICD-10-CM

## 2014-03-13 NOTE — Patient Instructions (Signed)
Contraception Choices  Birth control (contraception) is the use of any methods or devices to stop pregnancy from happening. Below are some methods to help avoid pregnancy.  HORMONAL BIRTH CONTROL  · A small tube put under the skin of the upper arm (implant). The tube can stay in place for 3 years. The implant must be taken out after 3 years.  · Shots given every 3 months.  · Pills taken every day.  · Patches that are changed once a week.  · A ring put into the vagina (vaginal ring). The ring is left in place for 3 weeks and removed for 1 week. Then, a new ring is put in the vagina.  · Emergency birth control pills taken after unprotected sex (intercourse).  BARRIER BIRTH CONTROL   · A thin covering worn on the penis (female condom) during sex.  · A soft, loose covering put into the vagina (female condom) before sex.  · A rubber bowl that sits over the cervix (diaphragm). The bowl must be made for you. The bowl is put into the vagina before sex. The bowl is left in place for 6 to 8 hours after sex.  · A small, soft cup that fits over the cervix (cervical cap). The cup must be made for you. The cup can be left in place for 48 hours after sex.  · A sponge that is put into the vagina before sex.  · A chemical that kills or stops sperm from getting into the cervix and uterus (spermicide). The chemical may be a cream, jelly, foam, or pill.  INTRAUTERINE (IUD) BIRTH CONTROL   · IUD birth control is a small, T-shaped piece of plastic. The plastic is put inside the uterus. There are 2 types of IUD:  ¨ Copper IUD. The IUD is covered in copper wire. The copper makes a fluid that kills sperm. It can stay in place for 10 years.  ¨ Hormone IUD. The hormone stops pregnancy from happening. It can stay in place for 5 years.  PERMANENT METHODS  · When the woman has her fallopian tubes sealed, tied, or blocked during surgery. This stops the egg from traveling to the uterus.  · The doctor places a small coil or insert into each fallopian  tube. This causes scar tissue to form and blocks the fallopian tubes.  · When the female has the tubes that carry sperm tied off (vasectomy).  NATURAL FAMILY PLANNING BIRTH CONTROL   · Natural family planning means not having sex or using barrier birth control on the days the woman could become pregnant.  · Use a calendar to keep track of the length of each period and know the days she can get pregnant.  · Avoid sex during ovulation.  · Use a thermometer to measure body temperature. Also watch for symptoms of ovulation.  · Time sex to be after the woman has ovulated.  Use condoms to help protect yourself against sexually transmitted infections (STIs). Do this no matter what type of birth control you use. Talk to your doctor about which type of birth control is best for you.  Document Released: 06/08/2009 Document Revised: 08/16/2013 Document Reviewed: 03/02/2013  ExitCare® Patient Information ©2015 [employer]. This information is not intended to replace advice given to you by your health care provider. Make sure you discuss any questions you have with your health care provider.

## 2014-03-13 NOTE — Progress Notes (Signed)
Subjective:    Kristin Vang is a 18 y.o. female being seen today for her obstetrical visit. She is at 7583w3d gestation. Patient reports no complaints. Fetal movement: normal.  Problem List Items Addressed This Visit     Unprioritized   Supervision of normal first pregnancy - Primary   Relevant Orders      POCT urinalysis dipstick     Patient Active Problem List   Diagnosis Date Noted  . Microcephaly 01/20/2014  . 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 Supervision of normal first pregnancy 11/04/2013  . Constipation 10/18/2013  . White coat hypertension 10/18/2013   Objective:    BP 109/76  Pulse 99  Temp(Src) 98.5 F (36.9 C)  Wt 85.276 kg (188 lb)  LMP 07/29/2013 FHT:  130 BPM  Uterine Size: size equals dates  Presentation: cephalic     Assessment:    Pregnancy @ 6783w3d weeks   Plan:     labs reviewed, problem list updated Consent signed. Pediatrician: discussed. Infant feeding: plans to breastfeed.  Cigarette smoking: never smoked. Orders Placed This Encounter  Procedures  . POCT urinalysis dipstick   No orders of the defined types were placed in this encounter.   Follow up in 2 Weeks.

## 2014-03-15 ENCOUNTER — Encounter (HOSPITAL_COMMUNITY): Payer: Self-pay

## 2014-03-15 ENCOUNTER — Ambulatory Visit (HOSPITAL_COMMUNITY)
Admission: RE | Admit: 2014-03-15 | Discharge: 2014-03-15 | Disposition: A | Discharge status: Home / Self Care | Payer: Medicaid Other | Source: Ambulatory Visit | Attending: Obstetrics & Gynecology | Admitting: Obstetrics & Gynecology

## 2014-03-15 DIAGNOSIS — O358XX0 Maternal care for other (suspected) fetal abnormality and damage, not applicable or unspecified: Secondary | ICD-10-CM | POA: Insufficient documentation

## 2014-03-15 DIAGNOSIS — O289 Unspecified abnormal findings on antenatal screening of mother: Secondary | ICD-10-CM | POA: Diagnosis not present

## 2014-03-15 DIAGNOSIS — Q02 Microcephaly: Secondary | ICD-10-CM

## 2014-03-15 DIAGNOSIS — Z3689 Encounter for other specified antenatal screening: Secondary | ICD-10-CM | POA: Insufficient documentation

## 2014-03-15 DIAGNOSIS — O283 Abnormal ultrasonic finding on antenatal screening of mother: Secondary | ICD-10-CM

## 2014-03-17 ENCOUNTER — Encounter (HOSPITAL_COMMUNITY): Payer: Self-pay | Admitting: *Deleted

## 2014-03-17 ENCOUNTER — Inpatient Hospital Stay (HOSPITAL_COMMUNITY)
Admission: AD | Admit: 2014-03-17 | Discharge: 2014-03-17 | Disposition: A | Discharge status: Home / Self Care | Payer: Medicaid Other | Source: Ambulatory Visit | Attending: Obstetrics & Gynecology | Admitting: Obstetrics & Gynecology

## 2014-03-17 DIAGNOSIS — O99891 Other specified diseases and conditions complicating pregnancy: Secondary | ICD-10-CM | POA: Diagnosis not present

## 2014-03-17 DIAGNOSIS — R109 Unspecified abdominal pain: Secondary | ICD-10-CM | POA: Insufficient documentation

## 2014-03-17 DIAGNOSIS — K5900 Constipation, unspecified: Secondary | ICD-10-CM

## 2014-03-17 DIAGNOSIS — Z87891 Personal history of nicotine dependence: Secondary | ICD-10-CM | POA: Diagnosis not present

## 2014-03-17 DIAGNOSIS — O9989 Other specified diseases and conditions complicating pregnancy, childbirth and the puerperium: Principal | ICD-10-CM

## 2014-03-17 NOTE — MAU Provider Note (Signed)
  History     CSN: [id number]  Arrival date and time: 03/17/14 1315   None     Chief Complaint  Patient presents with  . Abdominal Pain   HPI 18 year old female at 6137w0d gestation who presents with complaint of abdominal pain. Pain began 2 days ago on the left side. Pain is intermittent, dull, worsened by change in position. No vaginal bleeding, no contractions, no gush of fluid, positive fetal movement. No flank pain. Pain does not radiate. She has no shortness of breath or fever. No diarrhea. Patient has bowel movements every 3-4 days. She used to take colace but doesn't anymore.    History reviewed. No pertinent past medical history.  History reviewed. No pertinent past surgical history.  Family History  Problem Relation Age of Onset  . Hypertension Mother     History  Substance Use Topics  . Smoking status: Former Games developermoker  . Smokeless tobacco: Never Used  . Alcohol Use: No    Allergies: No Known Allergies  Prescriptions prior to admission  Medication Sig Dispense Refill  . Prenatal Vit-Fe Fumarate-FA (PRENATAL MULTIVITAMIN) TABS tablet Take 1 tablet by mouth daily at 12 noon.        Review of Systems  Constitutional: Negative for fever and chills.  HENT: Negative for hearing loss.   Eyes: Negative for blurred vision and photophobia.  Respiratory: Negative for cough and shortness of breath.   Cardiovascular: Negative for chest pain and palpitations.  Gastrointestinal: Negative for heartburn, nausea, vomiting, abdominal pain, diarrhea and constipation.  Genitourinary: Negative for dysuria and urgency.  Neurological: Negative for dizziness and headaches.  Psychiatric/Behavioral: Negative for depression and suicidal ideas.   Physical Exam   Blood pressure 110/83, pulse 18, temperature 98.3 F (36.8 C), resp. rate 18, last menstrual period 07/29/2013.  Physical Exam  Constitutional: She is oriented to person, place, and time. She appears well-developed and  well-nourished.  Sitting up in bed in no acute distress  Cardiovascular: Normal rate and regular rhythm.   Respiratory: Effort normal and breath sounds normal.  GI: Soft.  Mild tenderness to palpation of the left flank. No rebound or guarding  Genitourinary: Vagina normal.  Cervical exam closed, thick and high  Neurological: She is alert and oriented to person, place, and time.  Skin: Skin is warm.    MAU Course  Procedures  MDM Urinalysis to evaluate for UTI, kidney stone.  Assessment and Plan  18 year old G1 at 5237w0d with abdominal pain. Suspect constipation as source. Encouraged to use miralax BID. Could be round ligament pain. Encouraged pregnancy belt. If symptoms worsen, return for re-evaluation. Cervix is closed. Does not seem pregnancy related. Discharge home.  Markus JarvisStephens, Devin A 03/17/2014, 3:14 PM   I have seen and examined this patient and I agree with the above. FHR 130s, +accels, no decels; rare ctx. 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 Cam HaiSHAW, KIMBERLY CNM 3:44 PM 03/17/2014

## 2014-03-17 NOTE — Discharge Instructions (Signed)

## 2014-03-17 NOTE — MAU Note (Signed)
Pt presents to MAU with complaints of pain in the right and left side of her abdomen. Pain goes away with lying down. Denies any vaginal bleeding or LOF.

## 2014-03-20 LAB — POCT URINALYSIS DIPSTICK
Glucose, UA: NEGATIVE
Ketones, UA: NEGATIVE
Leukocytes, UA: NEGATIVE
NITRITE UA: NEGATIVE
PH UA: 6
Protein, UA: NEGATIVE
RBC UA: NEGATIVE
Spec Grav, UA: 1.015

## 2014-03-26 ENCOUNTER — Encounter (HOSPITAL_COMMUNITY): Payer: Self-pay

## 2014-03-26 ENCOUNTER — Inpatient Hospital Stay (HOSPITAL_COMMUNITY)
Admission: AD | Admit: 2014-03-26 | Discharge: 2014-03-26 | Disposition: A | Discharge status: Home / Self Care | Payer: Medicaid Other | Source: Ambulatory Visit | Attending: Obstetrics & Gynecology | Admitting: Obstetrics & Gynecology

## 2014-03-26 DIAGNOSIS — M545 Low back pain, unspecified: Secondary | ICD-10-CM | POA: Diagnosis present

## 2014-03-26 DIAGNOSIS — O99891 Other specified diseases and conditions complicating pregnancy: Secondary | ICD-10-CM | POA: Insufficient documentation

## 2014-03-26 DIAGNOSIS — M940 Chondrocostal junction syndrome [Tietze]: Secondary | ICD-10-CM | POA: Insufficient documentation

## 2014-03-26 DIAGNOSIS — Z87891 Personal history of nicotine dependence: Secondary | ICD-10-CM | POA: Insufficient documentation

## 2014-03-26 DIAGNOSIS — IMO0001 Reserved for inherently not codable concepts without codable children: Secondary | ICD-10-CM

## 2014-03-26 DIAGNOSIS — O9989 Other specified diseases and conditions complicating pregnancy, childbirth and the puerperium: Principal | ICD-10-CM

## 2014-03-26 HISTORY — DX: Other specified health status: Z78.9

## 2014-03-26 LAB — CBC WITH DIFFERENTIAL/PLATELET
BASOS ABS: 0 10*3/uL (ref 0.0–0.1)
BASOS PCT: 0 % (ref 0–1)
Band Neutrophils: 0 % (ref 0–10)
Blasts: 0 %
Eosinophils Absolute: 0 10*3/uL (ref 0.0–0.7)
Eosinophils Relative: 0 % (ref 0–5)
HCT: 31.9 % — ABNORMAL LOW (ref 36.0–46.0)
HEMOGLOBIN: 10.7 g/dL — AB (ref 12.0–15.0)
Lymphocytes Relative: 12 % (ref 12–46)
Lymphs Abs: 2.1 10*3/uL (ref 0.7–4.0)
MCH: 23.3 pg — AB (ref 26.0–34.0)
MCHC: 33.5 g/dL (ref 30.0–36.0)
MCV: 69.5 fL — ABNORMAL LOW (ref 78.0–100.0)
METAMYELOCYTES PCT: 0 %
MONOS PCT: 6 % (ref 3–12)
MYELOCYTES: 0 %
Monocytes Absolute: 1.1 10*3/uL — ABNORMAL HIGH (ref 0.1–1.0)
NEUTROS ABS: 14.5 10*3/uL — AB (ref 1.7–7.7)
Neutrophils Relative %: 82 % — ABNORMAL HIGH (ref 43–77)
Platelets: 245 10*3/uL (ref 150–400)
Promyelocytes Absolute: 0 %
RBC: 4.59 MIL/uL (ref 3.87–5.11)
RDW: 15.3 % (ref 11.5–15.5)
WBC: 17.7 10*3/uL — AB (ref 4.0–10.5)
nRBC: 0 /100 WBC

## 2014-03-26 LAB — URINALYSIS, ROUTINE W REFLEX MICROSCOPIC
Bilirubin Urine: NEGATIVE
GLUCOSE, UA: NEGATIVE mg/dL
HGB URINE DIPSTICK: NEGATIVE
Ketones, ur: 40 mg/dL — AB
Nitrite: NEGATIVE
PROTEIN: NEGATIVE mg/dL
Specific Gravity, Urine: <1.005 — ABNORMAL LOW (ref 1.005–1.030)
Urobilinogen, UA: 1 mg/dL (ref 0.0–1.0)
pH: 7 (ref 5.0–8.0)

## 2014-03-26 LAB — COMPREHENSIVE METABOLIC PANEL
ALK PHOS: 174 U/L — AB (ref 39–117)
ALT: 13 U/L (ref 0–35)
ANION GAP: 14 (ref 5–15)
AST: 17 U/L (ref 0–37)
Albumin: 3 g/dL — ABNORMAL LOW (ref 3.5–5.2)
BUN: 4 mg/dL — AB (ref 6–23)
CO2: 22 mEq/L (ref 19–32)
Calcium: 8.9 mg/dL (ref 8.4–10.5)
Chloride: 99 mEq/L (ref 96–112)
Creatinine, Ser: 0.59 mg/dL (ref 0.50–1.10)
GFR calc non Af Amer: >90 mL/min (ref >90)
GLUCOSE: 95 mg/dL (ref 70–99)
POTASSIUM: 4.4 meq/L (ref 3.7–5.3)
SODIUM: 135 meq/L — AB (ref 137–147)
TOTAL PROTEIN: 6.6 g/dL (ref 6.0–8.3)
Total Bilirubin: 0.3 mg/dL (ref 0.3–1.2)

## 2014-03-26 LAB — URINE MICROSCOPIC-ADD ON

## 2014-03-26 MED ORDER — CYCLOBENZAPRINE HCL 10 MG PO TABS
10.0000 mg | ORAL_TABLET | Freq: Once | ORAL | Status: AC
  Administered 2014-03-26: 10 mg via ORAL
  Filled 2014-03-26: qty 1

## 2014-03-26 MED ORDER — CYCLOBENZAPRINE HCL 10 MG PO TABS: 10.0000 mg | ORAL_TABLET | Freq: Three times a day (TID) | ORAL | Status: DC | PRN

## 2014-03-26 MED ORDER — ACETAMINOPHEN 325 MG PO TABS
650.0000 mg | ORAL_TABLET | Freq: Once | ORAL | Status: AC
  Administered 2014-03-26: 650 mg via ORAL
  Filled 2014-03-26: qty 2

## 2014-03-26 NOTE — MAU Note (Signed)
Pt states [facility]. Pain began at 0300 Saturday am. Was given two bags iv fluids, cath u/a, and was told pain was from dehydration. Pain starts at base of left ribcage wrapping around to back and up slightly towards chest. Pain is constant and hurts worse with deep inspiration.

## 2014-03-26 NOTE — MAU Provider Note (Signed)
CSN: [id number]     Arrival date & time 03/26/14  1727 History   None    Chief Complaint  Patient presents with  . Back Pain     (Consider location/radiation/quality/duration/timing/severity/associated sxs/prior Treatment) HPI Kristin Vang is a 18 y.o. G1P0 @ 5368w2d gestation who presents to the MAU with left side pain. She states she was evaluated here July 24 with same pain. [place] [place] and told she was dehydrated and given IV fluids. Today she continues to have the pain but rather than off and on has been persistent. She rates the pain as 10/10. The pain is worse with movement and with eating and drinking. The pain is better while taking a warm bath but then gets worse again. The patient states she wants to be sure that none of her organs are inflamed and really wants an ultrasound.  She has an appointment with DR. Tamela OddiJackson-Moore tomorrow.   Past Medical History  Diagnosis Date  . Medical history non-contributory    Past Surgical History  Procedure Laterality Date  . No past surgeries     Family History  Problem Relation Age of Onset  . Hypertension Mother    History  Substance Use Topics  . Smoking status: Former Games developermoker  . Smokeless tobacco: Never Used  . Alcohol Use: No   OB History   Grav Para Term Preterm Abortions TAB SAB Ect Mult Living   1              Review of Systems  Constitutional: Negative for fever and chills.  HENT: Negative.   Eyes: Negative for visual disturbance.  Respiratory: Negative for cough, chest tightness and shortness of breath.   Cardiovascular: Negative for chest pain and leg swelling.  Gastrointestinal: Positive for abdominal pain. Negative for nausea, vomiting, diarrhea and constipation.  Genitourinary: Positive for frequency. Negative for dysuria, decreased urine volume, vaginal bleeding and vaginal discharge.  Musculoskeletal: Positive for back pain.  Skin: Negative for rash.  Neurological: Negative for  light-headedness and headaches.  Psychiatric/Behavioral: Negative for confusion. The patient is not nervous/anxious.       Allergies  Review of patient's allergies indicates no known allergies.  Home Medications   Prior to Admission medications   Medication Sig Start Date End Date Taking? Authorizing Provider  acetaminophen (TYLENOL) 500 MG tablet Take 1,000 mg by mouth every 6 (six) hours as needed for headache.   Yes Historical Provider, MD  diphenhydrAMINE (BENADRYL) 25 MG tablet Take 25 mg by mouth daily as needed for sleep.   Yes Historical Provider, MD  Prenatal Vit-Fe Fumarate-FA (PRENATAL MULTIVITAMIN) TABS tablet Take 1 tablet by mouth daily.    Yes Historical Provider, MD  cyclobenzaprine (FLEXERIL) 10 MG tablet Take 1 tablet (10 mg total) by mouth 3 (three) times daily as needed for muscle spasms. 03/26/14   Wilmer FloorLisa A Leftwich-Kirby, CNM   BP 122/76  Pulse 109  Temp(Src) 98.9 F (37.2 C) (Oral)  Resp 18  Ht 5\' 4"  (1.626 m)  Wt 85.276 kg (188 lb)  BMI 32.25 kg/m2  LMP 07/29/2013 Physical Exam  Nursing note and vitals reviewed. Constitutional: She is oriented to person, place, and time. She appears well-developed and well-nourished.  HENT:  Head: Normocephalic.  Eyes: EOM are normal.  Neck: Neck supple.  Cardiovascular: Tachycardia present.   Pulmonary/Chest: Effort normal.  Abdominal: Soft. There is no tenderness.  Minimal tenderness with deep palpation LUQ and left anterior rib area   Genitourinary:  Dilation: Closed Effacement (%): Thick Station: -3 Exam by:: Damian Leavell, NP   Musculoskeletal: Normal range of motion.  Neurological: She is alert and oriented to person, place, and time. No cranial nerve deficit.  Skin: Skin is warm and dry.  Psychiatric: She has a normal mood and affect. Her behavior is normal.    ED Course  Procedures  Labs, PO fluids, tylenol PO, Flexeril PO EFM: baseline 150, reactive, occasional contraction Results for orders placed during the  hospital encounter of 03/26/14 (from the past 24 hour(s))  URINALYSIS, ROUTINE W REFLEX MICROSCOPIC     Status: Abnormal   Collection Time    03/26/14  5:45 PM      Result Value Ref Range   Color, Urine YELLOW  YELLOW   APPearance CLEAR  CLEAR   Specific Gravity, Urine <1.005 (*) 1.005 - 1.030   pH 7.0  5.0 - 8.0   Glucose, UA NEGATIVE  NEGATIVE mg/dL   Hgb urine dipstick NEGATIVE  NEGATIVE   Bilirubin Urine NEGATIVE  NEGATIVE   Ketones, ur 40 (*) NEGATIVE mg/dL   Protein, ur NEGATIVE  NEGATIVE mg/dL   Urobilinogen, UA 1.0  0.0 - 1.0 mg/dL   Nitrite NEGATIVE  NEGATIVE   Leukocytes, UA TRACE (*) NEGATIVE  URINE MICROSCOPIC-ADD ON     Status: Abnormal   Collection Time    03/26/14  5:45 PM      Result Value Ref Range   Squamous Epithelial / LPF MANY (*) RARE   WBC, UA 3-6  <3 WBC/hpf   Bacteria, UA MANY (*) RARE   Urine-Other MUCOUS PRESENT    CBC WITH DIFFERENTIAL     Status: Abnormal   Collection Time    03/26/14  7:15 PM      Result Value Ref Range   WBC 17.7 (*) 4.0 - 10.5 K/uL   RBC 4.59  3.87 - 5.11 MIL/uL   Hemoglobin 10.7 (*) 12.0 - 15.0 g/dL   HCT 16.1 (*) 09.6 - 04.5 %   MCV 69.5 (*) 78.0 - 100.0 fL   MCH 23.3 (*) 26.0 - 34.0 pg   MCHC 33.5  30.0 - 36.0 g/dL   RDW 40.9  81.1 - 91.4 %   Platelets 245  150 - 400 K/uL   Neutrophils Relative % 82 (*) 43 - 77 %   Lymphocytes Relative 12  12 - 46 %   Monocytes Relative 6  3 - 12 %   Eosinophils Relative 0  0 - 5 %   Basophils Relative 0  0 - 1 %   Band Neutrophils 0  0 - 10 %   Metamyelocytes Relative 0     Myelocytes 0     Promyelocytes Absolute 0     Blasts 0     nRBC 0  0 /100 WBC   Neutro Abs 14.5 (*) 1.7 - 7.7 K/uL   Lymphs Abs 2.1  0.7 - 4.0 K/uL   Monocytes Absolute 1.1 (*) 0.1 - 1.0 K/uL   Eosinophils Absolute 0.0  0.0 - 0.7 K/uL   Basophils Absolute 0.0  0.0 - 0.1 K/uL   RBC Morphology OVALOCYTES    COMPREHENSIVE METABOLIC PANEL     Status: Abnormal   Collection Time    03/26/14  7:15 PM       Result Value Ref Range   Sodium 135 (*) 137 - 147 mEq/L   Potassium 4.4  3.7 - 5.3 mEq/L   Chloride 99  96 - 112 mEq/L  CO2 22  19 - 32 mEq/L   Glucose, Bld 95  70 - 99 mg/dL   BUN 4 (*) 6 - 23 mg/dL   Creatinine, Ser 5.62  0.50 - 1.10 mg/dL   Calcium 8.9  8.4 - 13.0 mg/dL   Total Protein 6.6  6.0 - 8.3 g/dL   Albumin 3.0 (*) 3.5 - 5.2 g/dL   AST 17  0 - 37 U/L   ALT 13  0 - 35 U/L   Alkaline Phosphatase 174 (*) 39 - 117 U/L   Total Bilirubin 0.3  0.3 - 1.2 mg/dL   GFR calc non Af Amer >90  >90 mL/min   GFR calc Af Amer >90  >90 mL/min   Anion gap 14  5 - 15    Urine sent for culture.  MDM  18 y.o. female @ [redacted]w[redacted]d gestation with left side pain that increases with movement. Gave tylenol and patient states no pain relief. Will give Flexeril and reassess. Care turned over to [facility] @ 8pm. She will reassess the patient and disposition the patient.    A: 1. Acute costochondritis   2.  Musculoskeletal pain  P: D/C home Keep scheduled prenatal appointment tomorrow Flexeril 10 mg TID PRN Rest/ice/heat/Tylenol PRN Increase food/fluid intake.  Pt reports decreased appetite with pain.   Return to MAU as needed for emergencies  Sharen Counter Certified Nurse-Midwife

## 2014-03-26 NOTE — MAU Note (Signed)
Pt presents to MAU with complaints of pain in the lower right side of her back. Pt reports that [facility] and was given 2 bags of IV fluids for dehydration. States the pain is back and she isnt able to sleep.

## 2014-03-26 NOTE — Discharge Instructions (Signed)
Costochondritis Costochondritis, sometimes called Tietze syndrome, is a swelling and irritation (inflammation) of the tissue (cartilage) that connects your ribs with your breastbone (sternum). It causes pain in the chest and rib area. Costochondritis usually goes away on its own over time. It can take up to 6 weeks or longer to get better, especially if you are unable to limit your activities. CAUSES  Some cases of costochondritis have no known cause. Possible causes include:  Injury (trauma).  Exercise or activity such as lifting.  Severe coughing. SIGNS AND SYMPTOMS  Pain and tenderness in the chest and rib area.  Pain that gets worse when coughing or taking deep breaths.  Pain that gets worse with specific movements. DIAGNOSIS  Your health care provider will do a physical exam and ask about your symptoms. Chest X-rays or other tests may be done to rule out other problems. TREATMENT  Costochondritis usually goes away on its own over time. Your health care provider may prescribe medicine to help relieve pain. HOME CARE INSTRUCTIONS   Avoid exhausting physical activity. Try not to strain your ribs during normal activity. This would include any activities using chest, abdominal, and side muscles, especially if heavy weights are used.  Apply ice to the affected area for the first 2 days after the pain begins.  Put ice in a plastic bag.  Place a towel between your skin and the bag.  Leave the ice on for 20 minutes, 2-3 times a day.  Only take over-the-counter or prescription medicines as directed by your health care provider. SEEK MEDICAL CARE IF:  You have redness or swelling at the rib joints. These are signs of infection.  Your pain does not go away despite rest or medicine. SEEK IMMEDIATE MEDICAL CARE IF:   Your pain increases or you are very uncomfortable.  You have shortness of breath or difficulty breathing.  You cough up blood.  You have worse chest pains,  sweating, or vomiting.  You have a fever or persistent symptoms for more than 2-3 days.  You have a fever and your symptoms suddenly get worse. MAKE SURE YOU:   Understand these instructions.  Will watch your condition.  Will get help right away if you are not doing well or get worse. Document Released: 05/21/2005 Document Revised: 06/01/2013 Document Reviewed: 03/15/2013 [facility] Patient Information 2015 [place], [place]. This information is not intended to replace advice given to you by your health care provider. Make sure you discuss any questions you have with your health care provider. Musculoskeletal Pain Musculoskeletal pain is muscle and boney aches and pains. These pains can occur in any part of the body. Your caregiver may treat you without knowing the cause of the pain. They may treat you if blood or urine tests, X-rays, and other tests were normal.  CAUSES There is often not a definite cause or reason for these pains. These pains may be caused by a type of germ (virus). The discomfort may also come from overuse. Overuse includes working out too hard when your body is not fit. Boney aches also come from weather changes. Bone is sensitive to atmospheric pressure changes. HOME CARE INSTRUCTIONS  Ask when your test results will be ready. Make sure you get your test results. Only take over-the-counter or prescription medicines for pain, discomfort, or fever as directed by your caregiver. If you were given medications for your condition, do not drive, operate machinery or power tools, or sign legal documents for 24 hours. Do not drink alcohol. Do  not take sleeping pills or other medications that may interfere with treatment. Continue all activities unless the activities cause more pain. When the pain lessens, slowly resume normal activities. Gradually increase the intensity and duration of the activities or exercise. During periods of severe pain, bed rest may be helpful. Lay or sit in any  position that is comfortable. Putting ice on the injured area. Put ice in a bag. Place a towel between your skin and the bag. Leave the ice on for 15 to 20 minutes, 3 to 4 times a day. Follow up with your caregiver for continued problems and no reason can be found for the pain. If the pain becomes worse or does not go away, it may be necessary to repeat tests or do additional testing. Your caregiver may need to look further for a possible cause. SEEK IMMEDIATE MEDICAL CARE IF: You have pain that is getting worse and is not relieved by medications. You develop chest pain that is associated with shortness or breath, sweating, feeling sick to your stomach (nauseous), or throw up (vomit). Your pain becomes localized to the abdomen. You develop any new symptoms that seem different or that concern you. MAKE SURE YOU:  Understand these instructions. Will watch your condition. Will get help right away if you are not doing well or get worse. Document Released: 08/11/2005 Document Revised: 11/03/2011 Document Reviewed: 04/15/2013 [facility] Patient Information 2015 [place]. This information is not intended to replace advice given to you by your health care provider. Make sure you discuss any questions you have with your health care provider.

## 2014-03-27 ENCOUNTER — Ambulatory Visit (INDEPENDENT_AMBULATORY_CARE_PROVIDER_SITE_OTHER): Payer: Medicaid Other | Admitting: Obstetrics

## 2014-03-27 VITALS — Temp 98.2°F | Wt 184.0 lb

## 2014-03-27 DIAGNOSIS — Z3403 Encounter for supervision of normal first pregnancy, third trimester: Secondary | ICD-10-CM

## 2014-03-27 DIAGNOSIS — Z3400 Encounter for supervision of normal first pregnancy, unspecified trimester: Secondary | ICD-10-CM

## 2014-03-28 ENCOUNTER — Encounter (HOSPITAL_COMMUNITY): Payer: Self-pay | Admitting: *Deleted

## 2014-03-28 ENCOUNTER — Inpatient Hospital Stay (HOSPITAL_COMMUNITY)
Admission: AD | Admit: 2014-03-28 | Discharge: 2014-03-29 | Disposition: A | Discharge status: Home / Self Care | Payer: Medicaid Other | Source: Ambulatory Visit | Attending: Obstetrics | Admitting: Obstetrics

## 2014-03-28 ENCOUNTER — Encounter: Payer: Self-pay | Admitting: Obstetrics

## 2014-03-28 DIAGNOSIS — O26899 Other specified pregnancy related conditions, unspecified trimester: Secondary | ICD-10-CM

## 2014-03-28 DIAGNOSIS — O99891 Other specified diseases and conditions complicating pregnancy: Secondary | ICD-10-CM | POA: Insufficient documentation

## 2014-03-28 DIAGNOSIS — R1084 Generalized abdominal pain: Secondary | ICD-10-CM | POA: Diagnosis present

## 2014-03-28 DIAGNOSIS — Z8249 Family history of ischemic heart disease and other diseases of the circulatory system: Secondary | ICD-10-CM | POA: Diagnosis not present

## 2014-03-28 DIAGNOSIS — R109 Unspecified abdominal pain: Secondary | ICD-10-CM

## 2014-03-28 DIAGNOSIS — O9989 Other specified diseases and conditions complicating pregnancy, childbirth and the puerperium: Principal | ICD-10-CM

## 2014-03-28 DIAGNOSIS — Z87891 Personal history of nicotine dependence: Secondary | ICD-10-CM | POA: Diagnosis not present

## 2014-03-28 LAB — CULTURE, OB URINE

## 2014-03-28 NOTE — MAU Note (Signed)
Pt c/o contractions all day that have gotten more painful. States they are every couple of mins. Denies vag bleeding/LOF. +FM.

## 2014-03-28 NOTE — MAU Note (Signed)
Pt states she started having contractions about 1300. Contractions are about q4-5 as per pt.

## 2014-03-28 NOTE — Progress Notes (Signed)
Subjective:    Kristin Vang is a 18 y.o. female being seen today for her obstetrical visit. She is at 860w4d gestation. Patient reports no complaints. Fetal movement: normal.  Problem List Items Addressed This Visit   None     Patient Active Problem List   Diagnosis Date Noted  . Microcephaly 01/20/2014  . 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 Supervision of normal first pregnancy 11/04/2013  . Constipation 10/18/2013  . White coat hypertension 10/18/2013   Objective:    Temp(Src) 98.2 F (36.8 C)  Wt 184 lb (83.462 kg)  LMP 07/29/2013 FHT:  140 BPM  Uterine Size: size equals dates  Presentation: unsure     Assessment:    Pregnancy @ 10260w4d weeks   Plan:     labs reviewed, problem list updated Consent signed. GBS sent TDAP offered  Rhogam given for RH negative Pediatrician: discussed. Infant feeding: plans to breastfeed. Maternity leave: not discussed. Cigarette smoking: former smoker. No orders of the defined types were placed in this encounter.   No orders of the defined types were placed in this encounter.   Follow up in 1 Week.

## 2014-03-29 ENCOUNTER — Inpatient Hospital Stay (HOSPITAL_COMMUNITY): Payer: Medicaid Other

## 2014-03-29 DIAGNOSIS — O9989 Other specified diseases and conditions complicating pregnancy, childbirth and the puerperium: Secondary | ICD-10-CM

## 2014-03-29 DIAGNOSIS — R109 Unspecified abdominal pain: Secondary | ICD-10-CM

## 2014-03-29 LAB — URINALYSIS, ROUTINE W REFLEX MICROSCOPIC
BILIRUBIN URINE: NEGATIVE
GLUCOSE, UA: NEGATIVE mg/dL
HGB URINE DIPSTICK: NEGATIVE
KETONES UR: NEGATIVE mg/dL
Nitrite: NEGATIVE
PH: 7 (ref 5.0–8.0)
PROTEIN: NEGATIVE mg/dL
Specific Gravity, Urine: <1.005 — ABNORMAL LOW (ref 1.005–1.030)
Urobilinogen, UA: 0.2 mg/dL (ref 0.0–1.0)

## 2014-03-29 LAB — URINE MICROSCOPIC-ADD ON

## 2014-03-29 MED ORDER — HYDROCODONE-ACETAMINOPHEN 5-325 MG PO TABS
2.0000 | ORAL_TABLET | Freq: Once | ORAL | Status: DC
  Filled 2014-03-29: qty 2

## 2014-03-29 NOTE — MAU Provider Note (Signed)
History     CSN: [id number]  Arrival date and time: 03/28/14 2318   First Provider Initiated Contact with Patient 03/29/14 0002      Chief Complaint  Patient presents with  . Contractions   HPI Kristin Vang is a 18 y.o. G1P0 at 276w5d who presents today generalized abdominal pain. She states that the pain is all over her abdomen and back. She states that the pain is 10/10, and is constant. She denies any vaginal bleeding or LOF. She confirms fetal movement. She has not tried anything for the pain at this time. She thought that maybe she was having contractions, but she wasn't sure.    Past Medical History  Diagnosis Date  . Medical history non-contributory     Past Surgical History  Procedure Laterality Date  . No past surgeries      Family History  Problem Relation Age of Onset  . Hypertension Mother     History  Substance Use Topics  . Smoking status: Former Games developermoker  . Smokeless tobacco: Never Used  . Alcohol Use: No    Allergies: No Known Allergies  Prescriptions prior to admission  Medication Sig Dispense Refill  . acetaminophen (TYLENOL) 500 MG tablet Take 1,000 mg by mouth every 6 (six) hours as needed for headache.      . cyclobenzaprine (FLEXERIL) 10 MG tablet Take 1 tablet (10 mg total) by mouth 3 (three) times daily as needed for muscle spasms.  30 tablet  0  . diphenhydrAMINE (BENADRYL) 25 MG tablet Take 25 mg by mouth daily as needed for sleep.      . Prenatal Vit-Fe Fumarate-FA (PRENATAL MULTIVITAMIN) TABS tablet Take 1 tablet by mouth daily.         ROS Physical Exam   Blood pressure 120/80, pulse 140, temperature 97.7 F (36.5 C), temperature source Oral, resp. rate 20, height 5' 2.5" (1.588 m), weight 83.28 kg (183 lb 9.6 oz), last menstrual period 07/29/2013, SpO2 100.00%.  Physical Exam  Nursing note and vitals reviewed. Constitutional: She is oriented to person, place, and time. She appears well-developed and well-nourished. No distress.   Cardiovascular: Normal rate.   Respiratory: Effort normal.  GI: Soft. There is no tenderness. There is no rebound.  Neurological: She is alert and oriented to person, place, and time.  Skin: Skin is warm and dry.  Psychiatric: She has a normal mood and affect.   FHT 145, moderate with 15x15 accels for a brief  period initially then 145, moderate viability with no accels.  Toco: No UCs   BPP 8/8 MAU Course  Procedures Results for orders placed during the hospital encounter of 03/28/14 (from the past 24 hour(s))  URINALYSIS, ROUTINE W REFLEX MICROSCOPIC     Status: Abnormal   Collection Time    03/28/14 11:32 PM      Result Value Ref Range   Color, Urine YELLOW  YELLOW   APPearance CLEAR  CLEAR   Specific Gravity, Urine <1.005 (*) 1.005 - 1.030   pH 7.0  5.0 - 8.0   Glucose, UA NEGATIVE  NEGATIVE mg/dL   Hgb urine dipstick NEGATIVE  NEGATIVE   Bilirubin Urine NEGATIVE  NEGATIVE   Ketones, ur NEGATIVE  NEGATIVE mg/dL   Protein, ur NEGATIVE  NEGATIVE mg/dL   Urobilinogen, UA 0.2  0.0 - 1.0 mg/dL   Nitrite NEGATIVE  NEGATIVE   Leukocytes, UA TRACE (*) NEGATIVE  URINE MICROSCOPIC-ADD ON     Status: Abnormal   Collection Time  03/28/14 11:32 PM      Result Value Ref Range   Squamous Epithelial / LPF RARE  RARE   WBC, UA 3-6  <3 WBC/hpf   RBC / HPF 0-2  <3 RBC/hpf   Bacteria, UA FEW (*) RARE     Assessment and Plan   1. Abdominal pain during pregnancy    Third trimester precautions Return to MAU as needed  Follow-up Information   Follow up with HARPER,CHARLES A, MD. (As scheduled)    Specialty:  Obstetrics and Gynecology   Contact information:   [address] [place] [address] [phone]        Tawnya Crook 03/29/2014, 12:32 AM

## 2014-03-29 NOTE — Discharge Instructions (Signed)

## 2014-04-03 ENCOUNTER — Ambulatory Visit (INDEPENDENT_AMBULATORY_CARE_PROVIDER_SITE_OTHER): Payer: Medicaid Other | Admitting: Obstetrics

## 2014-04-03 ENCOUNTER — Encounter: Payer: Self-pay | Admitting: Obstetrics

## 2014-04-03 VITALS — BP 124/81 | HR 115 | Temp 98.1°F | Wt 187.0 lb

## 2014-04-03 DIAGNOSIS — Z3403 Encounter for supervision of normal first pregnancy, third trimester: Secondary | ICD-10-CM

## 2014-04-03 DIAGNOSIS — Z3400 Encounter for supervision of normal first pregnancy, unspecified trimester: Secondary | ICD-10-CM

## 2014-04-03 NOTE — Progress Notes (Signed)
Subjective:    Kristin Vang is a 18 y.o. female being seen today for her obstetrical visit. She is at 4871w3d gestation. Patient reports no complaints. Fetal movement: normal.  Problem List Items Addressed This Visit   Supervision of normal first pregnancy - Primary   Relevant Orders      POCT urinalysis dipstick      Strep B DNA probe     Patient Active Problem List   Diagnosis Date Noted  . Microcephaly 01/20/2014  . 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 Supervision of normal first pregnancy 11/04/2013  . Constipation 10/18/2013  . White coat hypertension 10/18/2013   Objective:    BP 124/81  Pulse 115  Temp(Src) 98.1 F (36.7 C)  Wt 187 lb (84.823 kg)  LMP 07/29/2013 FHT:  140 BPM  Uterine Size: size equals dates  Presentation: breech     Assessment:    Pregnancy @ 4971w3d weeks   Plan:     labs reviewed, problem list updated Consent signed. GBS sent TDAP offered  Rhogam given for RH negative Pediatrician: discussed. Infant feeding: plans to breastfeed. Maternity leave: discussed. Cigarette smoking: former smoker. Orders Placed This Encounter  Procedures  . Strep B DNA probe  . POCT urinalysis dipstick   No orders of the defined types were placed in this encounter.   Follow up in 1 Week.

## 2014-04-04 ENCOUNTER — Telehealth: Payer: Self-pay | Admitting: *Deleted

## 2014-04-04 LAB — POCT URINALYSIS DIPSTICK
BILIRUBIN UA: NEGATIVE
Blood, UA: NEGATIVE
GLUCOSE UA: NEGATIVE
Ketones, UA: NEGATIVE
LEUKOCYTES UA: NEGATIVE
NITRITE UA: NEGATIVE
Protein, UA: NEGATIVE
Spec Grav, UA: 1.01
Urobilinogen, UA: NEGATIVE
pH, UA: 7

## 2014-04-04 LAB — WET PREP BY MOLECULAR PROBE
CANDIDA SPECIES: NEGATIVE
Gardnerella vaginalis: NEGATIVE
TRICHOMONAS VAG: NEGATIVE

## 2014-04-04 LAB — STREP B DNA PROBE: STREP GROUP B AG: NOT DETECTED

## 2014-04-04 NOTE — Telephone Encounter (Signed)
Patient called requesting test results. Results reviewed with patient and she verbalized understanding.

## 2014-04-06 ENCOUNTER — Other Ambulatory Visit (HOSPITAL_COMMUNITY): Payer: Self-pay

## 2014-04-06 DIAGNOSIS — O289 Unspecified abnormal findings on antenatal screening of mother: Secondary | ICD-10-CM

## 2014-04-12 ENCOUNTER — Encounter: Payer: Medicaid Other | Admitting: Obstetrics & Gynecology

## 2014-04-13 ENCOUNTER — Encounter: Payer: Self-pay | Admitting: Obstetrics & Gynecology

## 2014-04-13 ENCOUNTER — Ambulatory Visit (INDEPENDENT_AMBULATORY_CARE_PROVIDER_SITE_OTHER): Payer: Medicaid Other | Admitting: Obstetrics & Gynecology

## 2014-04-13 VITALS — BP 100/80 | Temp 98.8°F | Wt 190.0 lb

## 2014-04-13 DIAGNOSIS — O321XX1 Maternal care for breech presentation, fetus 1: Secondary | ICD-10-CM

## 2014-04-13 DIAGNOSIS — O3090 Multiple gestation, unspecified, unspecified trimester: Secondary | ICD-10-CM

## 2014-04-13 DIAGNOSIS — O321XX0 Maternal care for breech presentation, not applicable or unspecified: Secondary | ICD-10-CM | POA: Insufficient documentation

## 2014-04-13 NOTE — Progress Notes (Signed)
Patient is tent[facility]

## 2014-04-16 NOTE — Patient Instructions (Signed)
External Cephalic Version External cephalic version is turning a baby that is presenting his or her buttocks first (breech) or is lying sideways in the uterus (transverse) to a head-first position. This makes the labor and delivery faster, safer for the mother and baby, and lessens the chance for a cesarean section. It should not be tried until the pregnancy is [redacted] weeks along or longer. BEFORE THE PROCEDURE   Do not take aspirin.  Do not eat for 4 hours before the procedure.  Tell your caregiver if you have a cold, fever, or an infection.  Tell your caregiver if you are having contractions.  Tell your caregiver if you are leaking or had a gush of fluid from your vagina.  Tell your caregiver if you have any vaginal bleeding or abnormal discharge.  If you are being admitted the same day, arrive at the hospital at least one hour before the procedure to sign any necessary documents and to get prepared for the procedure.  Tell your caregiver if you had any problems with anesthetics in the past.  Tell your caregiver if you are taking any medications that your caregiver does not know about. This includes over-the-counter and prescription drugs, herbs, eye drops and creams. PROCEDURE  First, an ultrasound is done to make sure the baby is breech or transverse.  A non-stress test or biophysical profile is done on the baby before the ECV. This is done to make sure it is safe for the baby to have the ECV. It may also be done after the procedure to make sure the baby is okay.  ECV is done in the delivery/surgical room with an anesthesiologist present. There should be a setup for an emergency cesarean section with a full nursing and nursery staff available and ready.  The patient may be given a medication to relax the uterine muscles. An epidural may be given for any discomfort. It is helpful for the success of the ECV.  An electronic fetal monitor is placed on the uterus during the procedure to  make sure the baby is okay.  If the mother is Rh-negative, Rho (D) immune globulin will be given to her to prevent Rh problems for future pregnancies.  The mother is followed closely for 2 to 3 hours after the procedure to make sure no problems develop. BENEFITS OF ECV  Easier and safer labor and delivery for the mother and baby.  Lower incidence of cesarean section.  Lower costs with a vaginal delivery. RISKS OF ECV  The placenta pulls away from the wall of the uterus before delivery (abruption of the placenta).  Rupture of the uterus, especially in patients with a previous cesarean section.  Fetal distress.  Early (premature) labor.  Premature rupture of the membranes.  The baby will return to the breech or transverse lie position.  Death of the fetus can happen but is very rare. ECV SHOULD BE STOPPED IF:  The fetal heart tones drop.  The mother is having a lot of pain.  You cannot turn the baby after several attempts. ECV SHOULD NOT BE DONE IF:  The non-stress test or biophysical profile is abnormal.  There is vaginal bleeding.  An abnormal shaped uterus is present.  There is heart disease or uncontrolled high blood pressure in the mother.  There are twins or more.  The placenta covers the opening of the cervix (placenta previa).  You had a previous cesarean section with a classical incision or major surgery of the uterus.  There   is not enough amniotic fluid in the sac (oligohydramnios).  The baby is too small for the pregnancy or has not developed normally (anomaly).  Your membranes have ruptured. HOME CARE INSTRUCTIONS   Have someone take you home after the procedure.  Rest at home for several hours.  Have someone stay with you for a few hours after you get home.  After ECV, continue with your prenatal visits as directed.  Continue your regular diet, rest and activities.  Do not do any strenuous activities for a couple of days. SEEK IMMEDIATE  MEDICAL CARE IF:   You develop vaginal bleeding.  You have fluid coming out of your vagina (bag of water may have broken).  You develop uterine contractions.  You do not feel the baby move or there is less movement of the baby.  You develop abdominal pain.  You develop an oral temperature of 102 F (38.9 C) or higher. Document Released: 02/03/2007 Document Revised: 12/26/2013 Document Reviewed: 11/29/2008 [employer] Patient Information 2015 [employer]. This information is not intended to replace advice given to you by your health care provider. Make sure you discuss any questions you have with your health care provider.  

## 2014-04-16 NOTE — Progress Notes (Signed)
Subjective:    Kristin Vang is a 18 y.o. female being seen today for her obstetrical visit. She is at 108w6d gestation. Patient reports no complaints. Fetal movement: normal.  Problem List Items Addressed This Visit     Unprioritized   Breech presentation - Primary     Patient Active Problem List   Diagnosis Date Noted  . Breech presentation 04/13/2014  . Microcephaly 01/20/2014  . 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 Supervision of normal first pregnancy 11/04/2013  . Constipation 10/18/2013  . White coat hypertension 10/18/2013   Objective:    BP 100/80  Temp(Src) 98.8 F (37.1 C)  Wt 86.183 kg (190 lb)  LMP 07/29/2013 FHT:  140 BPM  Uterine Size: size equals dates  Presentation: breech     Assessment:    Pregnancy @ [redacted]w[redacted]d weeks  Breech presentation Plan:     labs reviewed, problem list updated Counseled re: possible ECV; will consult with MFM to determine if she is an appropriate candidate Flu vaccine offered   Follow up in 2 Weeks.

## 2014-04-17 ENCOUNTER — Encounter (HOSPITAL_COMMUNITY): Payer: Self-pay | Admitting: *Deleted

## 2014-04-17 ENCOUNTER — Telehealth (HOSPITAL_COMMUNITY): Payer: Self-pay | Admitting: *Deleted

## 2014-04-17 NOTE — Telephone Encounter (Signed)
Preadmission screen  

## 2014-04-19 ENCOUNTER — Encounter: Payer: Self-pay | Admitting: Obstetrics & Gynecology

## 2014-04-19 ENCOUNTER — Ambulatory Visit (INDEPENDENT_AMBULATORY_CARE_PROVIDER_SITE_OTHER): Payer: Medicaid Other | Admitting: Obstetrics

## 2014-04-19 ENCOUNTER — Ambulatory Visit (HOSPITAL_COMMUNITY)
Admission: RE | Admit: 2014-04-19 | Discharge: 2014-04-19 | Disposition: A | Discharge status: Home / Self Care | Payer: Medicaid Other | Source: Ambulatory Visit | Attending: Obstetrics & Gynecology | Admitting: Obstetrics & Gynecology

## 2014-04-19 VITALS — BP 122/84 | HR 113 | Wt 190.0 lb

## 2014-04-19 VITALS — BP 119/86 | HR 103 | Temp 98.6°F | Wt 188.0 lb

## 2014-04-19 DIAGNOSIS — O358XX0 Maternal care for other (suspected) fetal abnormality and damage, not applicable or unspecified: Secondary | ICD-10-CM | POA: Diagnosis not present

## 2014-04-19 DIAGNOSIS — O321XX0 Maternal care for breech presentation, not applicable or unspecified: Secondary | ICD-10-CM | POA: Diagnosis not present

## 2014-04-19 DIAGNOSIS — O289 Unspecified abnormal findings on antenatal screening of mother: Secondary | ICD-10-CM | POA: Insufficient documentation

## 2014-04-19 DIAGNOSIS — O321XX1 Maternal care for breech presentation, fetus 1: Secondary | ICD-10-CM

## 2014-04-19 DIAGNOSIS — O283 Abnormal ultrasonic finding on antenatal screening of mother: Secondary | ICD-10-CM

## 2014-04-19 DIAGNOSIS — O3090 Multiple gestation, unspecified, unspecified trimester: Secondary | ICD-10-CM

## 2014-04-19 DIAGNOSIS — Z3403 Encounter for supervision of normal first pregnancy, third trimester: Secondary | ICD-10-CM

## 2014-04-19 DIAGNOSIS — Z3400 Encounter for supervision of normal first pregnancy, unspecified trimester: Secondary | ICD-10-CM

## 2014-04-19 NOTE — Progress Notes (Signed)
Subjective:    Kristin Vang is a 18 y.o. female being seen today for her obstetrical visit. She is at [redacted]w[redacted]d gestation. Patient reports backache. Fetal movement: normal.  Problem List Items Addressed This Visit   None     Patient Active Problem List   Diagnosis Date Noted  . Breech presentation 04/13/2014  . Microcephaly 01/20/2014  . 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 Supervision of normal first pregnancy 11/04/2013  . Constipation 10/18/2013  . White coat hypertension 10/18/2013    Objective:    BP 119/86  Pulse 103  Temp(Src) 98.6 F (37 C)  Wt 188 lb (85.276 kg)  LMP 07/29/2013 FHT: 140 BPM  Uterine Size: size equals dates  Presentations: Breech  Pelvic Exam: Deferred    Assessment:    Pregnancy @ [redacted]w[redacted]d weeks   Plan:   Plans for delivery: C/Section scheduled; labs reviewed; problem list updated Counseling: Consent signed. Infant feeding: plans to breastfeed. Cigarette smoking: Former smoker. L&D discussion: symptoms of labor, discussed when to call, discussed what number to call, anesthetic/analgesic options reviewed and delivering clinician:  plans Physician. Postpartum supports and preparation: circumcision discussed and contraception plans discussed.  Follow up in 1 Week.

## 2014-04-20 ENCOUNTER — Inpatient Hospital Stay (HOSPITAL_COMMUNITY): Admission: RE | Admit: 2014-04-20 | Payer: Medicaid Other | Source: Ambulatory Visit

## 2014-04-20 ENCOUNTER — Encounter (HOSPITAL_COMMUNITY): Payer: Self-pay | Admitting: Pharmacy Technician

## 2014-04-20 ENCOUNTER — Other Ambulatory Visit: Payer: Self-pay | Admitting: *Deleted

## 2014-04-27 ENCOUNTER — Encounter: Payer: Self-pay | Admitting: Obstetrics & Gynecology

## 2014-04-27 ENCOUNTER — Encounter (HOSPITAL_COMMUNITY): Payer: Self-pay

## 2014-04-27 ENCOUNTER — Ambulatory Visit (INDEPENDENT_AMBULATORY_CARE_PROVIDER_SITE_OTHER): Payer: Medicaid Other | Admitting: Obstetrics & Gynecology

## 2014-04-27 ENCOUNTER — Encounter (HOSPITAL_COMMUNITY)
Admission: RE | Admit: 2014-04-27 | Discharge: 2014-04-27 | Disposition: A | Discharge status: Home / Self Care | Payer: Medicaid Other | Source: Ambulatory Visit | Attending: Obstetrics & Gynecology | Admitting: Obstetrics & Gynecology

## 2014-04-27 VITALS — BP 122/96 | Temp 98.1°F | Wt 193.0 lb

## 2014-04-27 VITALS — BP 124/92 | HR 118 | Resp 20 | Ht 63.0 in | Wt 193.0 lb

## 2014-04-27 DIAGNOSIS — Z3403 Encounter for supervision of normal first pregnancy, third trimester: Secondary | ICD-10-CM

## 2014-04-27 DIAGNOSIS — Z3400 Encounter for supervision of normal first pregnancy, unspecified trimester: Secondary | ICD-10-CM

## 2014-04-27 DIAGNOSIS — O283 Abnormal ultrasonic finding on antenatal screening of mother: Secondary | ICD-10-CM

## 2014-04-27 DIAGNOSIS — Q02 Microcephaly: Secondary | ICD-10-CM

## 2014-04-27 DIAGNOSIS — IMO0001 Reserved for inherently not codable concepts without codable children: Secondary | ICD-10-CM

## 2014-04-27 HISTORY — DX: Beta thalassemia: D56.1

## 2014-04-27 HISTORY — DX: Unspecified asthma, uncomplicated: J45.909

## 2014-04-27 LAB — CBC
HCT: 34.7 % — ABNORMAL LOW (ref 36.0–46.0)
Hemoglobin: 11.5 g/dL — ABNORMAL LOW (ref 12.0–15.0)
MCH: 22.9 pg — AB (ref 26.0–34.0)
MCHC: 33.1 g/dL (ref 30.0–36.0)
MCV: 69.1 fL — ABNORMAL LOW (ref 78.0–100.0)
PLATELETS: 254 10*3/uL (ref 150–400)
RBC: 5.02 MIL/uL (ref 3.87–5.11)
RDW: 15.8 % — ABNORMAL HIGH (ref 11.5–15.5)
WBC: 12.5 10*3/uL — ABNORMAL HIGH (ref 4.0–10.5)

## 2014-04-27 LAB — POCT URINALYSIS DIPSTICK
BILIRUBIN UA: NEGATIVE
Blood, UA: NEGATIVE
GLUCOSE UA: NEGATIVE
Ketones, UA: NEGATIVE
Leukocytes, UA: NEGATIVE
Nitrite, UA: NEGATIVE
SPEC GRAV UA: 1.025
UROBILINOGEN UA: 1
pH, UA: 5

## 2014-04-27 LAB — TYPE AND SCREEN
ABO/RH(D): B POS
Antibody Screen: NEGATIVE

## 2014-04-27 LAB — ABO/RH: ABO/RH(D): B POS

## 2014-04-27 MED ORDER — DEXTROSE 5 % IV SOLN
500.0000 mg | Freq: Once | INTRAVENOUS | Status: AC
  Administered 2014-04-28: 500 mg via INTRAVENOUS
  Filled 2014-04-27: qty 500

## 2014-04-27 NOTE — Patient Instructions (Addendum)
   Your procedure is scheduled on:  Friday, Sept 4  Enter through the [employer] of [facility] at: 630 AM Pick up the phone at the desk and dial [phone] and inform us of your arrival.  Please call this number if you have any problems the morning of surgery: [phone]  Remember: Do not eat or drink after midnight: Thursday Take these medicines the morning of surgery with a SIP OF WATER:  None  Do not wear jewelry, make-up, or FINGER nail polish No metal in your hair or on your body. Do not wear lotions, powders, perfumes.  You may wear deodorant.  Do not bring valuables to the hospital. Contacts, dentures or bridgework may not be worn into surgery.  Leave suitcase in the car. After Surgery it may be brought to your room. For patients being admitted to the hospital, checkout time is 11:00am the day of discharge.  Home with fiance Mikey cell [phone]

## 2014-04-27 NOTE — Progress Notes (Signed)
Subjective:    Kristin Vang is a 18 y.o. female being seen today for her obstetrical visit. She is at [redacted]w[redacted]d gestation. Patient reports backache. Fetal movement: normal.  Problem List Items Addressed This Visit     Unprioritized   Supervision of normal first pregnancy - Primary   Relevant Orders      POCT urinalysis dipstick     Patient Active Problem List   Diagnosis Date Noted  . Breech presentation 04/13/2014  . Microcephaly 01/20/2014  . 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 Supervision of normal first pregnancy 11/04/2013  . Constipation 10/18/2013  . White coat hypertension 10/18/2013    Objective:    BP 122/96  Temp(Src) 98.1 F (36.7 C)  Wt 87.544 kg (193 lb)  LMP 07/29/2013 FHT: 140 BPM  Uterine Size: size equals dates  Presentations: Breech  Pelvic Exam: Deferred    Assessment:    Pregnancy @ [redacted]w[redacted]d weeks   Plan:   Plans for delivery: C/Section scheduled for tomorrow

## 2014-04-28 ENCOUNTER — Encounter (HOSPITAL_COMMUNITY): Payer: Self-pay | Admitting: *Deleted

## 2014-04-28 ENCOUNTER — Inpatient Hospital Stay (HOSPITAL_COMMUNITY): Payer: Medicaid Other | Admitting: Anesthesiology

## 2014-04-28 ENCOUNTER — Encounter (HOSPITAL_COMMUNITY)
Admission: RE | Disposition: A | Discharge status: Home / Self Care | Payer: Self-pay | Source: Ambulatory Visit | Attending: Obstetrics

## 2014-04-28 ENCOUNTER — Inpatient Hospital Stay (HOSPITAL_COMMUNITY)
Admission: RE | Admit: 2014-04-28 | Discharge: 2014-05-01 | DRG: 766 | Disposition: A | Discharge status: Home / Self Care | Payer: Medicaid Other | Source: Ambulatory Visit | Attending: Obstetrics | Admitting: Obstetrics

## 2014-04-28 ENCOUNTER — Encounter (HOSPITAL_COMMUNITY): Payer: Medicaid Other | Admitting: Anesthesiology

## 2014-04-28 DIAGNOSIS — IMO0001 Reserved for inherently not codable concepts without codable children: Secondary | ICD-10-CM

## 2014-04-28 DIAGNOSIS — Z98891 History of uterine scar from previous surgery: Secondary | ICD-10-CM

## 2014-04-28 DIAGNOSIS — O9902 Anemia complicating childbirth: Secondary | ICD-10-CM | POA: Diagnosis present

## 2014-04-28 DIAGNOSIS — Q02 Microcephaly: Secondary | ICD-10-CM

## 2014-04-28 DIAGNOSIS — Z87891 Personal history of nicotine dependence: Secondary | ICD-10-CM

## 2014-04-28 DIAGNOSIS — D561 Beta thalassemia: Secondary | ICD-10-CM | POA: Diagnosis present

## 2014-04-28 DIAGNOSIS — O283 Abnormal ultrasonic finding on antenatal screening of mother: Secondary | ICD-10-CM

## 2014-04-28 DIAGNOSIS — O321XX0 Maternal care for breech presentation, not applicable or unspecified: Principal | ICD-10-CM | POA: Diagnosis present

## 2014-04-28 DIAGNOSIS — Z8249 Family history of ischemic heart disease and other diseases of the circulatory system: Secondary | ICD-10-CM | POA: Diagnosis not present

## 2014-04-28 LAB — RPR

## 2014-04-28 SURGERY — Surgical Case
Anesthesia: Spinal | Site: Abdomen

## 2014-04-28 MED ORDER — DEXTROSE 5 % IV SOLN: 1.0000 ug/kg/h | INTRAVENOUS | Status: DC | PRN

## 2014-04-28 MED ORDER — 0.9 % SODIUM CHLORIDE (POUR BTL) OPTIME
TOPICAL | Status: DC | PRN
  Administered 2014-04-28: 1000 mL

## 2014-04-28 MED ORDER — DIPHENHYDRAMINE HCL 25 MG PO CAPS: 25.0000 mg | ORAL_CAPSULE | Freq: Four times a day (QID) | ORAL | Status: DC | PRN

## 2014-04-28 MED ORDER — PHENYLEPHRINE 40 MCG/ML (10ML) SYRINGE FOR IV PUSH (FOR BLOOD PRESSURE SUPPORT)
PREFILLED_SYRINGE | INTRAVENOUS | Status: AC
  Filled 2014-04-28: qty 5

## 2014-04-28 MED ORDER — FENTANYL CITRATE 0.05 MG/ML IJ SOLN
25.0000 ug | INTRAMUSCULAR | Status: DC | PRN
  Administered 2014-04-28 (×2): 50 ug via INTRAVENOUS

## 2014-04-28 MED ORDER — OXYTOCIN 40 UNITS IN LACTATED RINGERS INFUSION - SIMPLE MED: 62.5000 mL/h | INTRAVENOUS | Status: AC

## 2014-04-28 MED ORDER — TETANUS-DIPHTH-ACELL PERTUSSIS 5-2.5-18.5 LF-MCG/0.5 IM SUSP: 0.5000 mL | Freq: Once | INTRAMUSCULAR | Status: DC

## 2014-04-28 MED ORDER — KETOROLAC TROMETHAMINE 30 MG/ML IJ SOLN
30.0000 mg | Freq: Four times a day (QID) | INTRAMUSCULAR | Status: DC | PRN
  Administered 2014-04-28: 30 mg via INTRAMUSCULAR

## 2014-04-28 MED ORDER — ONDANSETRON HCL 4 MG/2ML IJ SOLN: 4.0000 mg | Freq: Three times a day (TID) | INTRAMUSCULAR | Status: DC | PRN

## 2014-04-28 MED ORDER — OXYTOCIN 40 UNITS IN LACTATED RINGERS INFUSION - SIMPLE MED
INTRAVENOUS | Status: DC | PRN
  Administered 2014-04-28: 40 [IU] via INTRAVENOUS

## 2014-04-28 MED ORDER — OXYTOCIN 10 UNIT/ML IJ SOLN
INTRAMUSCULAR | Status: AC
  Filled 2014-04-28: qty 4

## 2014-04-28 MED ORDER — SIMETHICONE 80 MG PO CHEW
80.0000 mg | CHEWABLE_TABLET | ORAL | Status: DC
  Administered 2014-04-29 – 2014-04-30 (×3): 80 mg via ORAL
  Filled 2014-04-28 (×3): qty 1

## 2014-04-28 MED ORDER — IBUPROFEN 600 MG PO TABS: 600.0000 mg | ORAL_TABLET | Freq: Four times a day (QID) | ORAL | Status: DC | PRN

## 2014-04-28 MED ORDER — OXYCODONE-ACETAMINOPHEN 5-325 MG PO TABS: 2.0000 | ORAL_TABLET | ORAL | Status: DC | PRN

## 2014-04-28 MED ORDER — MENTHOL 3 MG MT LOZG: 1.0000 | LOZENGE | OROMUCOSAL | Status: DC | PRN

## 2014-04-28 MED ORDER — BUPIVACAINE IN DEXTROSE 0.75-8.25 % IT SOLN
INTRATHECAL | Status: DC | PRN
  Administered 2014-04-28: 1.4 mL via INTRATHECAL

## 2014-04-28 MED ORDER — SENNOSIDES-DOCUSATE SODIUM 8.6-50 MG PO TABS
2.0000 | ORAL_TABLET | ORAL | Status: DC
  Administered 2014-04-29 – 2014-04-30 (×3): 2 via ORAL
  Filled 2014-04-28 (×3): qty 2

## 2014-04-28 MED ORDER — SCOPOLAMINE 1 MG/3DAYS TD PT72
MEDICATED_PATCH | TRANSDERMAL | Status: AC
  Administered 2014-04-28: 1.5 mg via TRANSDERMAL
  Filled 2014-04-28: qty 1

## 2014-04-28 MED ORDER — ONDANSETRON HCL 4 MG PO TABS: 4.0000 mg | ORAL_TABLET | ORAL | Status: DC | PRN

## 2014-04-28 MED ORDER — PHENYLEPHRINE 8 MG IN D5W 100 ML (0.08MG/ML) PREMIX OPTIME
INJECTION | INTRAVENOUS | Status: DC | PRN
Start: 2014-04-28 — End: 2014-04-28
  Administered 2014-04-28: 60 ug/min via INTRAVENOUS

## 2014-04-28 MED ORDER — CEFAZOLIN SODIUM-DEXTROSE 2-3 GM-% IV SOLR
INTRAVENOUS | Status: AC
  Filled 2014-04-28: qty 50

## 2014-04-28 MED ORDER — WITCH HAZEL-GLYCERIN EX PADS
1.0000 | MEDICATED_PAD | CUTANEOUS | Status: DC | PRN
Start: 2014-04-28 — End: 2014-05-01

## 2014-04-28 MED ORDER — LACTATED RINGERS IV SOLN
INTRAVENOUS | Status: DC
  Administered 2014-04-28: 19:00:00 via INTRAVENOUS

## 2014-04-28 MED ORDER — ONDANSETRON HCL 4 MG/2ML IJ SOLN
INTRAMUSCULAR | Status: DC | PRN
  Administered 2014-04-28: 4 mg via INTRAVENOUS

## 2014-04-28 MED ORDER — LACTATED RINGERS IV SOLN
Freq: Once | INTRAVENOUS | Status: AC
  Administered 2014-04-28 (×2): via INTRAVENOUS

## 2014-04-28 MED ORDER — METOCLOPRAMIDE HCL 5 MG/ML IJ SOLN: 10.0000 mg | Freq: Three times a day (TID) | INTRAMUSCULAR | Status: DC | PRN

## 2014-04-28 MED ORDER — ONDANSETRON HCL 4 MG/2ML IJ SOLN
INTRAMUSCULAR | Status: AC
  Filled 2014-04-28: qty 2

## 2014-04-28 MED ORDER — DIBUCAINE 1 % RE OINT: 1.0000 "application " | TOPICAL_OINTMENT | RECTAL | Status: DC | PRN

## 2014-04-28 MED ORDER — MEPERIDINE HCL 25 MG/ML IJ SOLN: 6.2500 mg | INTRAMUSCULAR | Status: DC | PRN

## 2014-04-28 MED ORDER — DIPHENHYDRAMINE HCL 50 MG/ML IJ SOLN
12.5000 mg | INTRAMUSCULAR | Status: DC | PRN
Start: 2014-04-28 — End: 2014-05-01

## 2014-04-28 MED ORDER — NALOXONE HCL 0.4 MG/ML IJ SOLN: 0.4000 mg | INTRAMUSCULAR | Status: DC | PRN

## 2014-04-28 MED ORDER — ACETAMINOPHEN 500 MG PO TABS
1000.0000 mg | ORAL_TABLET | Freq: Four times a day (QID) | ORAL | Status: AC
  Administered 2014-04-28 – 2014-04-29 (×3): 1000 mg via ORAL
  Filled 2014-04-28 (×3): qty 2

## 2014-04-28 MED ORDER — FENTANYL CITRATE 0.05 MG/ML IJ SOLN
INTRAMUSCULAR | Status: AC
  Filled 2014-04-28: qty 2

## 2014-04-28 MED ORDER — NALBUPHINE HCL 10 MG/ML IJ SOLN: 5.0000 mg | INTRAMUSCULAR | Status: DC | PRN

## 2014-04-28 MED ORDER — IBUPROFEN 600 MG PO TABS
600.0000 mg | ORAL_TABLET | Freq: Four times a day (QID) | ORAL | Status: DC
  Administered 2014-04-28 – 2014-05-01 (×11): 600 mg via ORAL
  Filled 2014-04-28 (×11): qty 1

## 2014-04-28 MED ORDER — SODIUM CHLORIDE 0.9 % IJ SOLN: 3.0000 mL | INTRAMUSCULAR | Status: DC | PRN

## 2014-04-28 MED ORDER — PHENYLEPHRINE HCL 10 MG/ML IJ SOLN
INTRAMUSCULAR | Status: AC
  Filled 2014-04-28: qty 1

## 2014-04-28 MED ORDER — LANOLIN HYDROUS EX OINT: 1.0000 "application " | TOPICAL_OINTMENT | CUTANEOUS | Status: DC | PRN

## 2014-04-28 MED ORDER — SIMETHICONE 80 MG PO CHEW: 80.0000 mg | CHEWABLE_TABLET | ORAL | Status: DC | PRN

## 2014-04-28 MED ORDER — FENTANYL CITRATE 0.05 MG/ML IJ SOLN
INTRAMUSCULAR | Status: DC | PRN
  Administered 2014-04-28: 15 ug via INTRATHECAL
  Administered 2014-04-28: 85 ug via INTRAVENOUS

## 2014-04-28 MED ORDER — PRENATAL MULTIVITAMIN CH
1.0000 | ORAL_TABLET | Freq: Every day | ORAL | Status: DC
  Administered 2014-04-29 – 2014-04-30 (×2): 1 via ORAL
  Filled 2014-04-28 (×2): qty 1

## 2014-04-28 MED ORDER — LACTATED RINGERS IV SOLN: INTRAVENOUS | Status: DC

## 2014-04-28 MED ORDER — LACTATED RINGERS IV SOLN
INTRAVENOUS | Status: DC | PRN
  Administered 2014-04-28: 09:00:00 via INTRAVENOUS

## 2014-04-28 MED ORDER — SCOPOLAMINE 1 MG/3DAYS TD PT72
1.0000 | MEDICATED_PATCH | Freq: Once | TRANSDERMAL | Status: DC
  Administered 2014-04-28: 1.5 mg via TRANSDERMAL

## 2014-04-28 MED ORDER — SIMETHICONE 80 MG PO CHEW
80.0000 mg | CHEWABLE_TABLET | Freq: Three times a day (TID) | ORAL | Status: DC
  Administered 2014-04-28 – 2014-05-01 (×9): 80 mg via ORAL
  Filled 2014-04-28 (×9): qty 1

## 2014-04-28 MED ORDER — NALBUPHINE HCL 10 MG/ML IJ SOLN
5.0000 mg | INTRAMUSCULAR | Status: DC | PRN
  Administered 2014-04-28: 10 mg via SUBCUTANEOUS

## 2014-04-28 MED ORDER — MORPHINE SULFATE (PF) 0.5 MG/ML IJ SOLN
INTRAMUSCULAR | Status: DC | PRN
  Administered 2014-04-28: 1.9 mg via INTRAVENOUS
  Administered 2014-04-28: .1 mg via INTRATHECAL

## 2014-04-28 MED ORDER — DIPHENHYDRAMINE HCL 50 MG/ML IJ SOLN: 25.0000 mg | INTRAMUSCULAR | Status: DC | PRN

## 2014-04-28 MED ORDER — PROMETHAZINE HCL 25 MG/ML IJ SOLN: 6.2500 mg | INTRAMUSCULAR | Status: DC | PRN

## 2014-04-28 MED ORDER — MIDAZOLAM HCL 2 MG/2ML IJ SOLN: 0.5000 mg | Freq: Once | INTRAMUSCULAR | Status: DC | PRN

## 2014-04-28 MED ORDER — ONDANSETRON HCL 4 MG/2ML IJ SOLN: 4.0000 mg | INTRAMUSCULAR | Status: DC | PRN

## 2014-04-28 MED ORDER — ZOLPIDEM TARTRATE 5 MG PO TABS: 5.0000 mg | ORAL_TABLET | Freq: Every evening | ORAL | Status: DC | PRN

## 2014-04-28 MED ORDER — KETOROLAC TROMETHAMINE 30 MG/ML IJ SOLN: 30.0000 mg | Freq: Four times a day (QID) | INTRAMUSCULAR | Status: DC | PRN

## 2014-04-28 MED ORDER — FENTANYL CITRATE 0.05 MG/ML IJ SOLN
INTRAMUSCULAR | Status: AC
  Administered 2014-04-28: 50 ug via INTRAVENOUS
  Filled 2014-04-28: qty 2

## 2014-04-28 MED ORDER — SCOPOLAMINE 1 MG/3DAYS TD PT72
1.0000 | MEDICATED_PATCH | Freq: Once | TRANSDERMAL | Status: DC
  Filled 2014-04-28: qty 1

## 2014-04-28 MED ORDER — CEFAZOLIN SODIUM-DEXTROSE 2-3 GM-% IV SOLR
2.0000 g | INTRAVENOUS | Status: AC
  Administered 2014-04-28: 2 g via INTRAVENOUS

## 2014-04-28 MED ORDER — OXYCODONE-ACETAMINOPHEN 5-325 MG PO TABS
1.0000 | ORAL_TABLET | ORAL | Status: DC | PRN
  Administered 2014-04-28 – 2014-05-01 (×7): 1 via ORAL
  Filled 2014-04-28 (×7): qty 1

## 2014-04-28 MED ORDER — KETOROLAC TROMETHAMINE 30 MG/ML IJ SOLN
INTRAMUSCULAR | Status: AC
  Administered 2014-04-28: 30 mg via INTRAMUSCULAR
  Filled 2014-04-28: qty 1

## 2014-04-28 MED ORDER — MORPHINE SULFATE 0.5 MG/ML IJ SOLN
INTRAMUSCULAR | Status: AC
  Filled 2014-04-28: qty 10

## 2014-04-28 MED ORDER — NALBUPHINE HCL 10 MG/ML IJ SOLN
INTRAMUSCULAR | Status: AC
  Filled 2014-04-28: qty 1

## 2014-04-28 MED ORDER — DIPHENHYDRAMINE HCL 25 MG PO CAPS: 25.0000 mg | ORAL_CAPSULE | ORAL | Status: DC | PRN

## 2014-04-28 SURGICAL SUPPLY — 45 items
BENZOIN TINCTURE PRP APPL 2/3 (GAUZE/BANDAGES/DRESSINGS) ×3 IMPLANT
BLADE SURG 10 STRL SS (BLADE) ×6 IMPLANT
CANISTER WOUND CARE 500ML ATS (WOUND CARE) IMPLANT
CLAMP CORD UMBIL (MISCELLANEOUS) ×3 IMPLANT
CLOSURE WOUND 1/2 X4 (GAUZE/BANDAGES/DRESSINGS) ×1
CLOTH BEACON ORANGE TIMEOUT ST (SAFETY) ×3 IMPLANT
CONTAINER PREFILL 10% NBF 15ML (MISCELLANEOUS) IMPLANT
DRAPE LG THREE QUARTER DISP (DRAPES) ×3 IMPLANT
DRESSING DISP NPWT PICO 4X12 (MISCELLANEOUS) IMPLANT
DRSG OPSITE POSTOP 4X10 (GAUZE/BANDAGES/DRESSINGS) ×3 IMPLANT
DRSG VAC ATS LRG SENSATRAC (GAUZE/BANDAGES/DRESSINGS) IMPLANT
DRSG VAC ATS SM SENSATRAC (GAUZE/BANDAGES/DRESSINGS) IMPLANT
DURAPREP 26ML APPLICATOR (WOUND CARE) ×3 IMPLANT
ELECT REM PT RETURN 9FT ADLT (ELECTROSURGICAL) ×3
ELECTRODE REM PT RTRN 9FT ADLT (ELECTROSURGICAL) ×1 IMPLANT
EXTRACTOR VACUUM M CUP 4 TUBE (SUCTIONS) IMPLANT
EXTRACTOR VACUUM M CUP 4' TUBE (SUCTIONS)
GLOVE BIO SURGEON STRL SZ 6.5 (GLOVE) ×2 IMPLANT
GLOVE BIO SURGEONS STRL SZ 6.5 (GLOVE) ×1
GOWN STRL REUS W/TWL LRG LVL3 (GOWN DISPOSABLE) ×9 IMPLANT
KIT ABG SYR 3ML LUER SLIP (SYRINGE) IMPLANT
NEEDLE HYPO 25X5/8 SAFETYGLIDE (NEEDLE) ×3 IMPLANT
NS IRRIG 1000ML POUR BTL (IV SOLUTION) ×3 IMPLANT
PACK C SECTION WH (CUSTOM PROCEDURE TRAY) ×3 IMPLANT
PAD OB MATERNITY 4.3X12.25 (PERSONAL CARE ITEMS) ×3 IMPLANT
RETRACTOR WND ALEXIS 25 LRG (MISCELLANEOUS) ×1 IMPLANT
RTRCTR C-SECT PINK 25CM LRG (MISCELLANEOUS) ×3 IMPLANT
RTRCTR WOUND ALEXIS 25CM LRG (MISCELLANEOUS) ×3
SCRUB PCMX 4 OZ (MISCELLANEOUS) IMPLANT
STAPLER VISISTAT 35W (STAPLE) ×3 IMPLANT
STRIP CLOSURE SKIN 1/2X4 (GAUZE/BANDAGES/DRESSINGS) ×2 IMPLANT
SUT MNCRL 0 VIOLET CTX 36 (SUTURE) ×2 IMPLANT
SUT MNCRL AB 3-0 PS2 27 (SUTURE) ×3 IMPLANT
SUT MONOCRYL 0 CTX 36 (SUTURE) ×4
SUT PDS AB 0 CTX 36 PDP370T (SUTURE) IMPLANT
SUT PLAIN 0 NONE (SUTURE) IMPLANT
SUT VIC AB 0 CTXB 36 (SUTURE) IMPLANT
SUT VIC AB 2-0 CT1 (SUTURE) ×3 IMPLANT
SUT VIC AB 2-0 CT1 27 (SUTURE) ×2
SUT VIC AB 2-0 CT1 TAPERPNT 27 (SUTURE) ×1 IMPLANT
SUT VIC AB 2-0 SH 27 (SUTURE)
SUT VIC AB 2-0 SH 27XBRD (SUTURE) IMPLANT
TOWEL OR 17X24 6PK STRL BLUE (TOWEL DISPOSABLE) ×3 IMPLANT
TRAY FOLEY CATH 14FR (SET/KITS/TRAYS/PACK) ×3 IMPLANT
WATER STERILE IRR 1000ML POUR (IV SOLUTION) IMPLANT

## 2014-04-28 NOTE — Anesthesia Postprocedure Evaluation (Signed)
  Anesthesia Post-op Note  Anesthesia Post Note  Patient: Kristin Vang  Procedure(s) Performed: Procedure(s) (LRB): PRIMARY CESAREAN SECTION (N/A)  Anesthesia type: Spinal  Patient location: PACU  Post pain: Pain level controlled  Post assessment: Post-op Vital signs reviewed  Last Vitals:  Filed Vitals:   04/28/14 1043  BP: 118/77  Pulse: 93  Temp: 37.1 C  Resp: 16    Post vital signs: Reviewed  Level of consciousness: awake  Complications: No apparent anesthesia complications

## 2014-04-28 NOTE — H&P (Signed)
Kristin Vang is a 18 y.o. femalepresenting for C/S for Breech presentation. Maternal Medical History:  Fetal activity: Perceived fetal activity is normal.   Last perceived fetal movement was within the past hour.    Prenatal complications: no prenatal complications Prenatal Complications - Diabetes: none.    OB History   Grav Para Term Preterm Abortions TAB SAB Ect Mult Living   1              Past Medical History  Diagnosis Date  . Medical history non-contributory   . Asthma     Hx as child, no problems as adult, no inhaler  . Beta thalassemia     recent dx during pregnancy   Past Surgical History  Procedure Laterality Date  . No past surgeries     Family History: family history includes Hypertension in her mother. Social History:  reports that she quit smoking about 11 months ago. Her smoking use included Cigarettes. She has a .25 pack-year smoking history. She has never used smokeless tobacco. She reports that she does not drink alcohol or use illicit drugs.   Prenatal Transfer Tool  Maternal Diabetes: No Genetic Screening: Normal Maternal Ultrasounds/Referrals: Normal Fetal Ultrasounds or other Referrals:  None Maternal Substance Abuse:  No Significant Maternal Medications:  None Significant Maternal Lab Results:  None Other Comments:  None  Review of Systems  All other systems reviewed and are negative.     Blood pressure 117/70, pulse 116, temperature 99.3 F (37.4 C), temperature source Oral, resp. rate 20, last menstrual period 07/29/2013, SpO2 99.00%. Maternal Exam:  Abdomen: Patient reports no abdominal tenderness. Fetal presentation: breech     Physical Exam  Nursing note and vitals reviewed. Constitutional: She is oriented to person, place, and time. She appears well-developed and well-nourished.  HENT:  Head: Normocephalic and atraumatic.  Eyes: Conjunctivae are normal. Pupils are equal, round, and reactive to light.  Neck: Normal range of  motion. Neck supple.  Cardiovascular: Normal rate and regular rhythm.   Respiratory: Effort normal.  GI: Soft.  Genitourinary: Vagina normal and uterus normal.  Musculoskeletal: Normal range of motion.  Neurological: She is alert and oriented to person, place, and time.  Skin: Skin is warm and dry.  Psychiatric: She has a normal mood and affect. Her behavior is normal. Judgment and thought content normal.    Prenatal labs: ABO, Rh: --/--/B POS (09/03 1346) Antibody: NEG (09/03 1346) Rubella: 1.56 (02/24 1441) RPR: NON REAC (05/19 1713)  HBsAg: NEGATIVE (02/24 1441)  HIV: NONREACTIVE (05/19 1713)  GBS: NOT DETECTED (08/10 1637)   Assessment/Plan: 39 weeks.  Breech.  For primary C/S.   HARPER,CHARLES A 04/28/2014, 8:00 AM

## 2014-04-28 NOTE — Patient Instructions (Signed)

## 2014-04-28 NOTE — Anesthesia Postprocedure Evaluation (Signed)
  Anesthesia Post-op Note  Patient: Kristin Vang  Procedure(s) Performed: Procedure(s): PRIMARY CESAREAN SECTION (N/A)  Patient Location: PACU and Mother/Baby  Anesthesia Type:Spinal  Level of Consciousness: awake, alert  and oriented  Airway and Oxygen Therapy: Patient Spontanous Breathing  Post-op Pain: mild  Post-op Assessment: Post-op Vital signs reviewed  Post-op Vital Signs: Reviewed and stable  Last Vitals:  Filed Vitals:   04/28/14 1635  BP: 122/72  Pulse: 101  Temp:   Resp:     Complications: No apparent anesthesia complications

## 2014-04-28 NOTE — Progress Notes (Signed)
UR completed 

## 2014-04-28 NOTE — Op Note (Signed)
Arman Filter26 N. M evert FeiOrlene Och TTAG>>MENT>

## 2014-04-28 NOTE — Transfer of Care (Signed)
Immediate Anesthesia Transfer of Care Note  Patient: Kristin Vang  Procedure(s) Performed: Procedure(s): PRIMARY CESAREAN SECTION (N/A)  Patient Location: PACU  Anesthesia Type:Spinal  Level of Consciousness: awake, alert  and oriented  Airway & Oxygen Therapy: Patient Spontanous Breathing  Post-op Assessment: Report given to PACU RN and Post -op Vital signs reviewed and stable  Post vital signs: Reviewed and stable  Complications: No apparent anesthesia complications

## 2014-04-28 NOTE — Addendum Note (Signed)
Addendum created 04/28/14 1726 by Armanda Heritage, CRNA   Modules edited: Notes Section   Notes Section:  File: [id number]

## 2014-04-28 NOTE — Anesthesia Preprocedure Evaluation (Signed)
Anesthesia Evaluation  Patient identified by MRN, date of birth, ID band Patient awake    Reviewed: Allergy & Precautions, H&P , NPO status , Patient's Chart, lab work & pertinent test results  Airway Mallampati: II      Dental   Pulmonary former smoker,  breath sounds clear to auscultation        Cardiovascular Exercise Tolerance: Good Rhythm:regular Rate:Normal     Neuro/Psych    GI/Hepatic   Endo/Other    Renal/GU      Musculoskeletal   Abdominal   Peds  Hematology   Anesthesia Other Findings   Reproductive/Obstetrics (+) Pregnancy                           Anesthesia Physical Anesthesia Plan  ASA: II  Anesthesia Plan: Spinal   Post-op Pain Management:    Induction:   Airway Management Planned:   Additional Equipment:   Intra-op Plan:   Post-operative Plan:   Informed Consent: I have reviewed the patients History and Physical, chart, labs and discussed the procedure including the risks, benefits and alternatives for the proposed anesthesia with the patient or authorized representative who has indicated his/her understanding and acceptance.     Plan Discussed with: Anesthesiologist, CRNA and Surgeon  Anesthesia Plan Comments:         Anesthesia Quick Evaluation  

## 2014-04-28 NOTE — Anesthesia Procedure Notes (Signed)
Spinal  Patient location during procedure: OR Start time: 04/28/2014 8:29 AM Staffing Performed by: anesthesiologist  Preanesthetic Checklist Completed: patient identified, site marked, surgical consent, pre-op evaluation, timeout performed, IV checked, risks and benefits discussed and monitors and equipment checked Spinal Block Patient position: sitting Prep: site prepped and draped and DuraPrep Patient monitoring: heart rate, cardiac monitor, continuous pulse ox and blood pressure Approach: midline Location: L3-4 Injection technique: single-shot Needle Needle type: Pencan  Needle gauge: 24 G Needle length: 9 cm Assessment Sensory level: T4 Additional Notes Clear free flow CSF on first attempt.  No paresthesia.  Patient tolerated procedure well with no apparent complications.  Jasmine December, MD

## 2014-04-29 LAB — CBC
HCT: 26 % — ABNORMAL LOW (ref 36.0–46.0)
Hemoglobin: 8.5 g/dL — ABNORMAL LOW (ref 12.0–15.0)
MCH: 22.5 pg — ABNORMAL LOW (ref 26.0–34.0)
MCHC: 32.3 g/dL (ref 30.0–36.0)
MCV: 69.5 fL — AB (ref 78.0–100.0)
Platelets: 196 10*3/uL (ref 150–400)
RBC: 3.74 MIL/uL — ABNORMAL LOW (ref 3.87–5.11)
RDW: 15.6 % — ABNORMAL HIGH (ref 11.5–15.5)
WBC: 12.2 10*3/uL — ABNORMAL HIGH (ref 4.0–10.5)

## 2014-04-29 LAB — BIRTH TISSUE RECOVERY COLLECTION (PLACENTA DONATION)

## 2014-04-29 NOTE — Progress Notes (Signed)
Subjective: Postpartum Day 1: Cesarean Delivery Patient reports tolerating PO.    Objective: Vital signs in last 24 hours: Temp:  [97.5 F (36.4 C)-98.8 F (37.1 C)] 98 F (36.7 C) (09/05 0603) Pulse Rate:  [76-106] 85 (09/05 0603) Resp:  [16-29] 18 (09/05 0603) BP: (100-128)/(52-77) 113/52 mmHg (09/05 0603) SpO2:  [94 %-100 %] 95 % (09/05 0221) Weight:  [193 lb (87.544 kg)] 193 lb (87.544 kg) (09/04 1120)  Physical Exam:  General: alert and no distress Lochia: appropriate Uterine Fundus: firm Incision: healing well DVT Evaluation: No evidence of DVT seen on physical exam.   Recent Labs  04/27/14 1346 04/29/14 0628  HGB 11.5* 8.5*  HCT 34.7* 26.0*    Assessment/Plan: Status post Cesarean section. Doing well postoperatively.  Continue current care.  Rolondo Pierre A 04/29/2014, 8:16 AM

## 2014-04-29 NOTE — Lactation Note (Signed)
This note was copied from the chart of Kristin Maxwell Martorano. Lactation Consultation Note: initial visit with mom. Baby now 41 hours old. Mom reports that her nipples are raw and she feels like her baby isn't getting latched on very well. Mom reports that it hurts through the whole feeding. Reviewed wide open mouth and keeping the baby close to the breast throughout the feeding. Baby has gotten 2 bottles of formula- last one about 1 1/2 hours ago. Baby asleep on mom's chest at present. Mom states she is waiting on someone bringing her nipple cream. Comfort gels given with instructions for use. Mom states she will get up and put her bra on and put them on. Encouraged to page for assist when baby wakes for next feeding. BF brochure given with resources for support after DC. No questions at present.   Patient Name: Kristin Vang [id number] Date: 04/29/2014 Reason for consult: Initial assessment   Maternal Data Formula Feeding for Exclusion: Yes Reason for exclusion: Mother's choice to formula and breast feed on admission Does the patient have breastfeeding experience prior to this delivery?: No  Feeding Feeding Type: Bottle Fed - Formula Nipple Type: Slow - flow  LATCH Score/Interventions                      Lactation Tools Discussed/Used Tools: Bottle   Consult Status Consult Status: Follow-up Date: 04/29/14 Follow-up type: In-patient    Pamelia Hoit 04/29/2014, 9:56 AM

## 2014-04-30 NOTE — Lactation Note (Signed)
This note was copied from the chart of Kristin Chanay Nugent. Lactation Consultation Note  Patient Name: Kristin Vang [id number] Date: 04/30/2014   Visited with Mom, baby at 75 hrs old.  Mom decided to give baby formula, and unsure if she wants to pump her breasts to collect breast milk.  Explained the routine of pumping and how it is normal for her not to obtain any breast milk at this time, but over a few days, her milk would increase.  Offered to set up a DEBP and instruct her on pumping both breasts.  Mom has a manual breast pump presently and only used it once as "she didn't have any milk".  Talked about the need for an adequate breast pump at discharge tomorrow.  Explained how the pump rental for 2 weeks works, has WIC.  Mom to think about it and call out for assistance with double pumping.  Offered assistance with latching baby as well.    [name] 04/30/2014, 11:38 AM

## 2014-04-30 NOTE — Progress Notes (Signed)
Subjective: Postpartum Day 2: Cesarean Delivery Patient reports tolerating PO, + flatus and no problems voiding.    Objective: Vital signs in last 24 hours: Temp:  [98.1 F (36.7 C)-98.4 F (36.9 C)] 98.4 F (36.9 C) (09/05 1806) Pulse Rate:  [93-106] 93 (09/05 1806) Resp:  [20] 20 (09/05 1806) BP: (112)/(66-69) 112/69 mmHg (09/05 1806) SpO2:  [97 %] 97 % (09/05 0810)  Physical Exam:  General: alert and no distress Lochia: appropriate Uterine Fundus: firm Incision: healing well DVT Evaluation: No evidence of DVT seen on physical exam.   Recent Labs  04/27/14 1346 04/29/14 0628  HGB 11.5* 8.5*  HCT 34.7* 26.0*    Assessment/Plan: Status post Cesarean section. Doing well postoperatively.  Anemia.  Clinically stable.  Iron started. Continue current care.  HARPER,CHARLES A 04/30/2014, 6:45 AM

## 2014-04-30 NOTE — Progress Notes (Signed)
Clinical Social Work Department PSYCHOSOCIAL ASSESSMENT - MATERNAL/CHILD 04/30/2014  Patient:  Kristin Vang,Kristin Vang  Account Number:  [id number]  Admit Date:  04/28/2014  Childs Name:   Kristin Vang    Clinical Social Worker:  Happy Begeman, LCSW   Date/Time:  04/30/2014 12:15 PM  Date Referred:  04/30/2014   Referral source  Central Nursery     Referred reason  Substance Abuse   Other referral source:    I:  FAMILY / HOME ENVIRONMENT Child's legal guardian:  PARENT  Guardian - Name Guardian - Age Guardian - Address  Osland,Kristin Vang 17 [address]  [place], [place] [id number]  Vang, Kristin 18 same as above   Other household support members/support persons Other support:    II  PSYCHOSOCIAL DATA Information Source:    Financial and Community Resources Employment:   FOB is employed   Financial resources:  Medicaid If Medicaid - County:   Other  WIC  Food Stamps   School / Grade:   Maternity Care Coordinator / Child Services Coordination / Early Interventions:  Cultural issues impacting care:    III  STRENGTHS Strengths  Adequate Resources  Supportive family/friends  Home prepared for Child (including basic supplies)   Strength comment:    IV  RISK FACTORS AND CURRENT PROBLEMS Current Problem:     Risk Factor & Current Problem Patient Issue Family Issue Risk Factor / Current Problem Comment  Substance Abuse Y N Mother has hx of marijuana use    V  SOCIAL WORK ASSESSMENT Acknowledged order for social work consult to address concerns regarding hx of Marijuana use.  Met with mother who was pleasant and receptive to CSW.  FOB was also present.  She is a single parent with no other dependents. Parents live together, and have been in a relationship for the past 5 years.  FOB is currently employed.     She reports use of marijuana early in the pregnancy before she found out that she was pregnant.  She denies hx of dependency, or need for treatment.   Mother was informed of the  hospital's drug screen policy.   UDS on newborn was negative.   She denies any hx of mental illness.      No acute social concerns noted at this time.   Mother informed of social work availability.      VI SOCIAL WORK PLAN Social Work Plan  No Barriers to Discharge   Type of pt/family education:   If child protective services report - county:   If child protective services report - date:   Information/referral to community resources comment:   Other social work plan:   Will continu eto monitor drug screen.     

## 2014-05-01 ENCOUNTER — Encounter (HOSPITAL_COMMUNITY): Payer: Self-pay | Admitting: Obstetrics

## 2014-05-01 MED ORDER — FUSION PLUS PO CAPS: 1.0000 | ORAL_CAPSULE | Freq: Every day | ORAL | Status: AC

## 2014-05-01 MED ORDER — IBUPROFEN 600 MG PO TABS: 600.0000 mg | ORAL_TABLET | Freq: Four times a day (QID) | ORAL | Status: AC | PRN

## 2014-05-01 MED ORDER — OXYCODONE-ACETAMINOPHEN 5-325 MG PO TABS: 2.0000 | ORAL_TABLET | ORAL | Status: AC | PRN

## 2014-05-01 NOTE — Discharge Summary (Signed)
Obstetric Discharge Summary Reason for Admission: cesarean section Prenatal Procedures: ultrasound Intrapartum Procedures: cesarean: low cervical, transverse Postpartum Procedures: none Complications-Operative and Postpartum: none Hemoglobin  Date Value Ref Range Status  04/29/2014 8.5* 12.0 - 15.0 g/dL Final     REPEATED TO VERIFY     DELTA CHECK NOTED     HCT  Date Value Ref Range Status  04/29/2014 26.0* 36.0 - 46.0 % Final    Physical Exam:  General: alert and no distress Lochia: appropriate Uterine Fundus: firm Incision: healing well DVT Evaluation: No evidence of DVT seen on physical exam.  Discharge Diagnoses: Term Pregnancy-delivered                                         Anemia.  Clinically stable.  Discharge Information: Date: 05/01/2014 Activity: pelvic rest Diet: routine Medications: None, Ibuprofen, Colace, Iron and Percocet Condition: stable Instructions: refer to practice specific booklet Discharge to: home Follow-up Information   Follow up with Antionette Char A, MD In 1 week.   Specialty:  Obstetrics and Gynecology   Contact information:   [address] [place] [address] [phone]       Newborn Data: Live born female  Birth Weight: 7 lb 4.8 oz (3310 g) APGAR: 8, 9  Home with mother.  Kristin Vang 05/01/2014, 5:16 AM

## 2014-05-01 NOTE — Progress Notes (Signed)
Subjective: Postpartum Day 3: Cesarean Delivery Patient reports tolerating PO, + flatus, + BM and no problems voiding.    Objective: Vital signs in last 24 hours: Temp:  [98.2 F (36.8 C)-98.3 F (36.8 C)] 98.2 F (36.8 C) (09/06 1843) Pulse Rate:  [96] 96 (09/06 1843) Resp:  [18-20] 20 (09/06 1843) BP: (113-117)/(71-73) 117/71 mmHg (09/06 1843)  Physical Exam:  General: alert and no distress Lochia: appropriate Uterine Fundus: firm Incision: healing well DVT Evaluation: No evidence of DVT seen on physical exam.   Recent Labs  04/29/14 0628  HGB 8.5*  HCT 26.0*    Assessment/Plan: Status post Cesarean section. Doing well postoperatively.  Discharge home with standard precautions and return to clinic in 2 weeks.  HARPER,CHARLES A 05/01/2014, 5:13 AM

## 2014-05-03 ENCOUNTER — Telehealth: Payer: Self-pay | Admitting: *Deleted

## 2014-05-03 ENCOUNTER — Inpatient Hospital Stay (HOSPITAL_COMMUNITY)
Admission: AD | Admit: 2014-05-03 | Discharge: 2014-05-03 | Disposition: A | Discharge status: Home / Self Care | Payer: Medicaid Other | Source: Ambulatory Visit | Attending: Obstetrics | Admitting: Obstetrics

## 2014-05-03 ENCOUNTER — Encounter (HOSPITAL_COMMUNITY): Payer: Self-pay

## 2014-05-03 DIAGNOSIS — IMO0001 Reserved for inherently not codable concepts without codable children: Secondary | ICD-10-CM

## 2014-05-03 DIAGNOSIS — T814XXA Infection following a procedure, initial encounter: Secondary | ICD-10-CM

## 2014-05-03 DIAGNOSIS — O909 Complication of the puerperium, unspecified: Secondary | ICD-10-CM | POA: Insufficient documentation

## 2014-05-03 DIAGNOSIS — T8189XA Other complications of procedures, not elsewhere classified, initial encounter: Secondary | ICD-10-CM

## 2014-05-03 DIAGNOSIS — Z87891 Personal history of nicotine dependence: Secondary | ICD-10-CM | POA: Insufficient documentation

## 2014-05-03 NOTE — Telephone Encounter (Signed)
MAU calling regarding patient. Patient has concerns regarding her incision. Patient thinks it is openingin a  2 1/2" area. Steri strips are in place. Call to OR- per Dr Clearance Coots (they are presently in surgery) have patient go to triage for evaluation. Patient notified and voiced understanding.

## 2014-05-03 NOTE — MAU Provider Note (Signed)
History     CSN: [id number]  Arrival date and time: 05/03/14 1638   First Provider Initiated Contact with Patient 05/03/14 1719      Chief Complaint  Patient presents with  . Wound Check   HPI Comments: Kristin Vang 18 y.o. G1P1001 presents to MAU for incision check. She had a C/S on 04/28/14 for Breech presentation. Since then she has done well until today when she noticed that she has more drainage from the left side than the right side. She denies any fever, redness, odor, swelling or pain.  Wound Check      Past Medical History  Diagnosis Date  . Medical history non-contributory   . Asthma     Hx as child, no problems as adult, no inhaler  . Beta thalassemia     recent dx during pregnancy    Past Surgical History  Procedure Laterality Date  . No past surgeries    . Cesarean section N/A 04/28/2014    Procedure: PRIMARY CESAREAN SECTION;  Surgeon: Brock Bad, MD;  Location: [facility];  Service: Obstetrics;  Laterality: N/A;    Family History  Problem Relation Age of Onset  . Hypertension Mother     History  Substance Use Topics  . Smoking status: Former Smoker -- 0.25 packs/day for 1 years    Types: Cigarettes    Quit date: 05/25/2013  . Smokeless tobacco: Never Used  . Alcohol Use: No    Allergies: No Known Allergies  Prescriptions prior to admission  Medication Sig Dispense Refill  . ibuprofen (ADVIL,MOTRIN) 600 MG tablet Take 1 tablet (600 mg total) by mouth every 6 (six) hours as needed for mild pain.  30 tablet  5  . Iron-FA-B Cmp-C-Biot-Probiotic (FUSION PLUS) CAPS Take 1 capsule by mouth daily before breakfast.  30 capsule  5  . oxyCODONE-acetaminophen (PERCOCET/ROXICET) 5-325 MG per tablet Take 2 tablets by mouth every 4 (four) hours as needed (for pain scale equal to or greater than 7).  40 tablet  0  . Prenatal Vit-Fe Fumarate-FA (PRENATAL MULTIVITAMIN) TABS tablet Take 1 tablet by mouth daily.         Review of Systems  Constitutional:  Negative.   HENT: Negative.   Eyes: Negative.   Respiratory: Negative.   Cardiovascular: Negative.   Gastrointestinal:       Incision with drainage  Genitourinary: Negative.   Musculoskeletal: Negative.   Skin: Negative.   Neurological: Negative.   Endo/Heme/Allergies: Negative.   Psychiatric/Behavioral: Negative.    Physical Exam   Blood pressure 117/83, pulse 90, temperature 98.6 F (37 C), temperature source Oral, resp. rate 18, last menstrual period 07/29/2013, SpO2 99.00%, unknown if currently breastfeeding.  Physical Exam  Constitutional: She is oriented to person, place, and time. She appears well-developed and well-nourished. No distress.  HENT:  Head: Normocephalic and atraumatic.  Eyes: Pupils are equal, round, and reactive to light.  GI: Soft. She exhibits no distension. There is no tenderness. There is no rebound.  Incision is healing well with steri strips in place. There is no redness, odor, purulent drainage or tenderness.  Musculoskeletal: Normal range of motion.  Neurological: She is alert and oriented to person, place, and time.  Skin: Skin is warm and dry.  Psychiatric: She has a normal mood and affect. Her behavior is normal. Judgment and thought content normal.   No results found for this or any previous visit (from the past 24 hour(s)).  MAU Course  Procedures  MDM  Assessment and Plan   A: Incision Check  P: Reassurance Follow up with Dr Clearance Coots or Haroldine Laws, Rubbie Battiest 05/03/2014, 5:34 PM

## 2014-05-03 NOTE — Discharge Instructions (Signed)

## 2014-05-03 NOTE — MAU Note (Signed)
Patient states she had a primary cesarean section on 9-4. States she noticed the incision was draining this am and is open. No increase pain.

## 2014-05-17 ENCOUNTER — Ambulatory Visit: Payer: Medicaid Other | Admitting: Obstetrics & Gynecology

## 2014-05-18 ENCOUNTER — Ambulatory Visit: Payer: Medicaid Other | Admitting: Obstetrics & Gynecology

## 2014-06-01 ENCOUNTER — Ambulatory Visit: Payer: Medicaid Other | Admitting: Obstetrics & Gynecology

## 2014-06-05 ENCOUNTER — Ambulatory Visit: Payer: Medicaid Other | Admitting: Obstetrics & Gynecology

## 2014-06-26 ENCOUNTER — Encounter (HOSPITAL_COMMUNITY): Payer: Self-pay

## 2014-08-21 ENCOUNTER — Encounter: Payer: Self-pay | Admitting: *Deleted

## 2014-08-22 ENCOUNTER — Encounter: Payer: Self-pay | Admitting: Obstetrics & Gynecology

## 2016-04-14 IMAGING — US US OB FOLLOW-UP
1 series · 12 of 28 positions shown · non-contrast
Comparison: none

OBSTETRICS REPORT
                      (Signed Final 03/15/2014 [DATE])

Service(s) Provided
 US OB FOLLOW UP                                       76816.1
Indications
 Fetal abnormality - other known or suspected
 (echogenic bowel)
 Low risk NIPS, Negative CF screen, Negative
 TORCH titers
Fetal Evaluation
 Num Of Fetuses:    1
 Fetal Heart Rate:  147                          bpm
 Cardiac Activity:  Observed
 Presentation:      Breech
 Placenta:          Anterior, above cervical os
 P. Cord            Previously Visualized
 Insertion:
 Amniotic Fluid
 AFI FV:      Subjectively within normal limits
 AFI Sum:     17.27   cm       63  %Tile     Larg Pckt:    5.71  cm
 RUQ:   5.71    cm   RLQ:    2.11   cm    LUQ:   4.54    cm   LLQ:    4.91   cm
Biometry
 BPD:     70.9  mm     G. Age:  28w 3d                CI:         75.2   70 - 86
 OFD:     94.3  mm                                    FL/HC:      22.5   19.9 -
 HC:     264.8  mm     G. Age:  28w 6d      < 3  %    HC/AC:      0.95   0.96 -
 AC:     277.7  mm     G. Age:  31w 6d       25  %    FL/BPD:     84.2   71 - 87
 FL:      59.7  mm     G. Age:  31w 1d        8  %    FL/AC:      21.5   20 - 24
 HUM:     53.5  mm     G. Age:  31w 1d       25  %
 Est. FW:    3423  gm    3 lb 11 oz      25  %
Gestational Age
 LMP:           32w 5d        Date:  07/29/13                 EDD:   05/05/14
 U/S Today:     30w 1d                                        EDD:   05/23/14
 Best:          32w 5d     Det. By:  LMP  (07/29/13)          EDD:   05/05/14
Anatomy
 Cranium:          Appears normal         Aortic Arch:      Previously seen
 Fetal Cavum:      Appears normal         Ductal Arch:      Previously seen
 Ventricles:       Appears normal         Diaphragm:        Previously seen
 Choroid Plexus:   Previously seen        Stomach:          Appears normal, left
                                                            sided
 Cerebellum:       Previously seen        Abdomen:          Appears normal
 Posterior Fossa:  Previously seen        Abdominal Wall:   Previously seen
 Nuchal Fold:      Not applicable (>20    Cord Vessels:     Previously seen
                   wks GA)
 Face:             Orbits and profile     Kidneys:          Appear normal
                   previously seen
 Lips:             Previously seen        Bladder:          Appears normal
 Heart:            Previously seen        Spine:            Previously seen
 RVOT:             Previously seen        Lower             Previously seen
                                          Extremities:
 LVOT:             Previously seen        Upper             Previously seen
 Other:  Female gender previously seen. Heels and 5th digit previously seen.
Cervix Uterus Adnexa
 Cervix:       Not visualized (advanced GA >32wks)
 Adnexa:     No abnormality visualized.
Impression
INDICATION: 17 yr old G1P0 at 67w6d with previous finding of
 echogenic bowel and microcephaly for follow up ultrasound.

[Series 1: us ob follow-up · 12 of 32 slices shown]
[im 2/32]
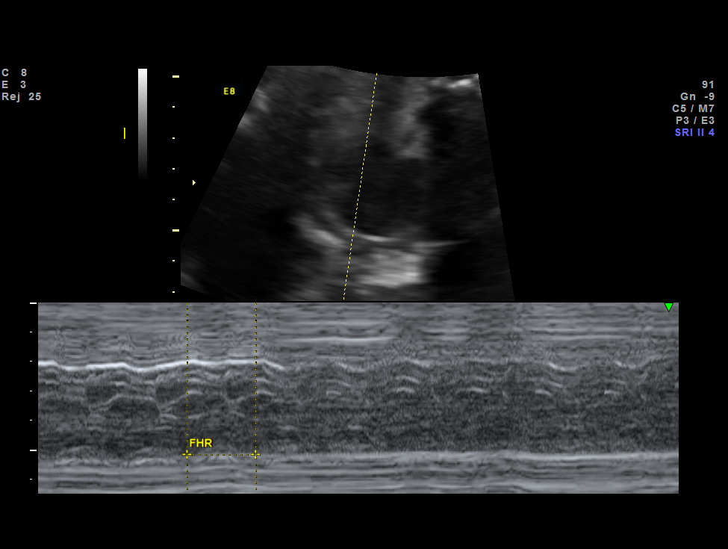
[im 4/32]
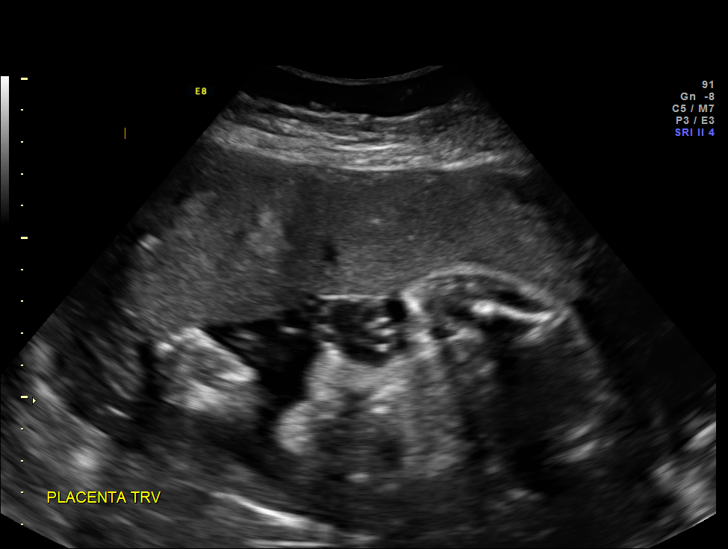
[im 6/32]
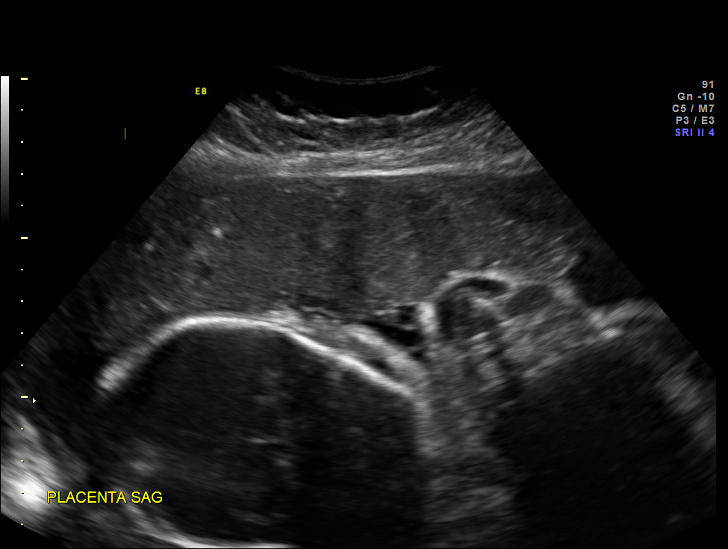
[im 10/32]
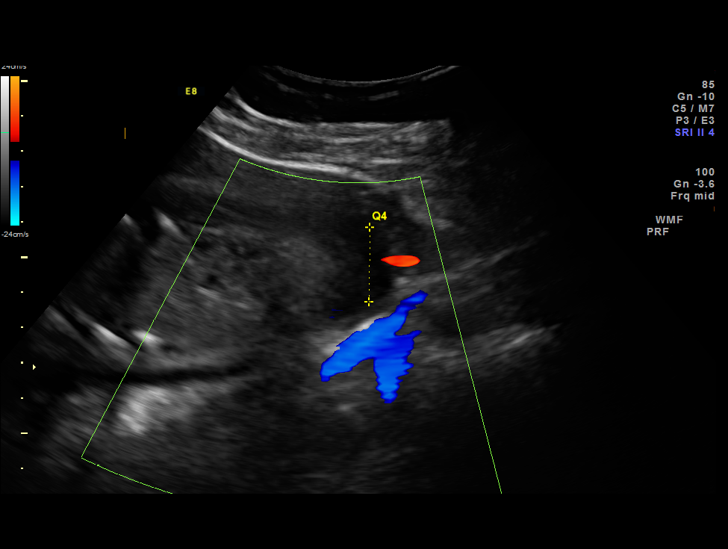
[im 12/32]
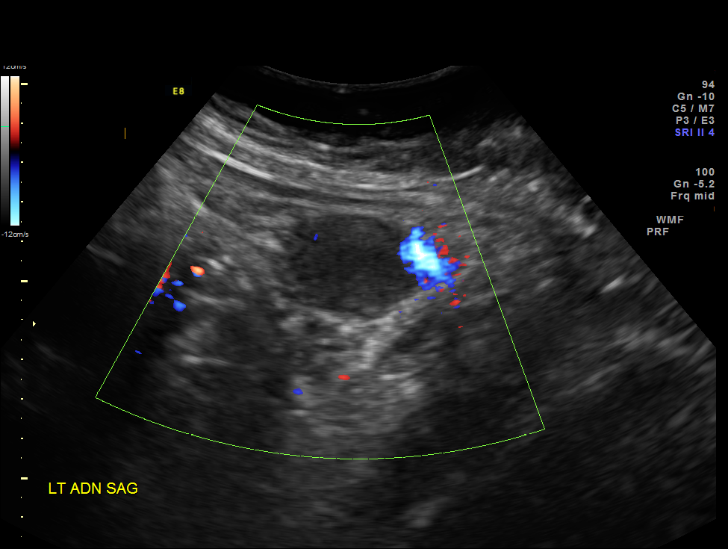
[im 14/32]
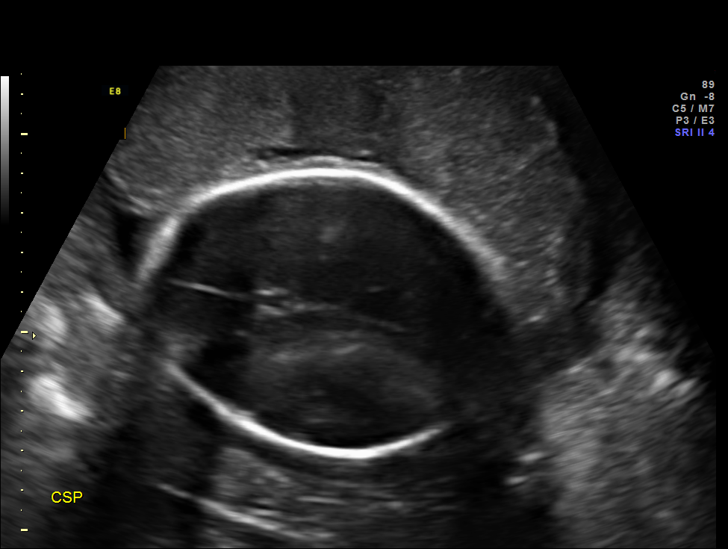
[im 18/32]
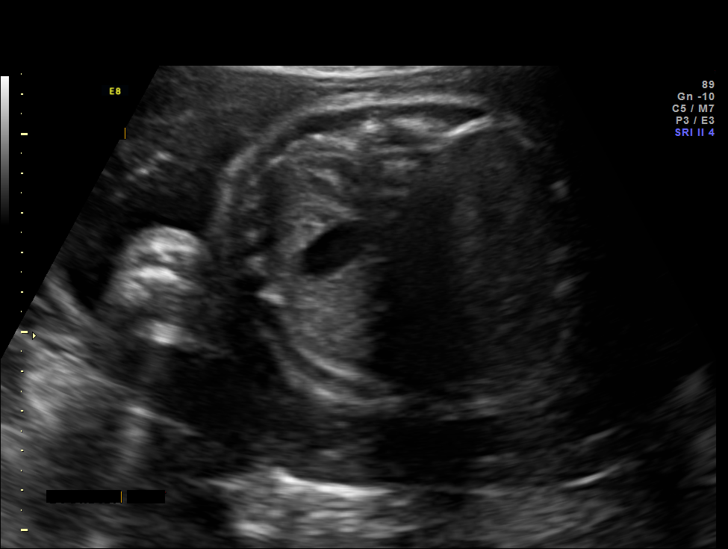
[im 20/32]
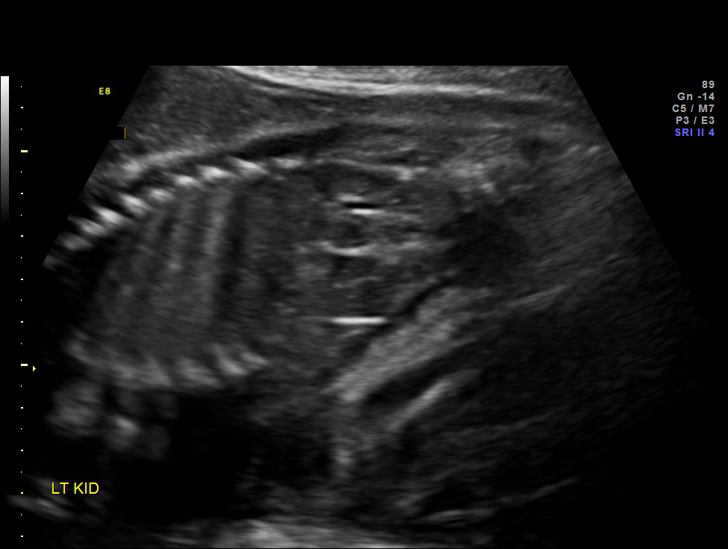
[im 22/32]
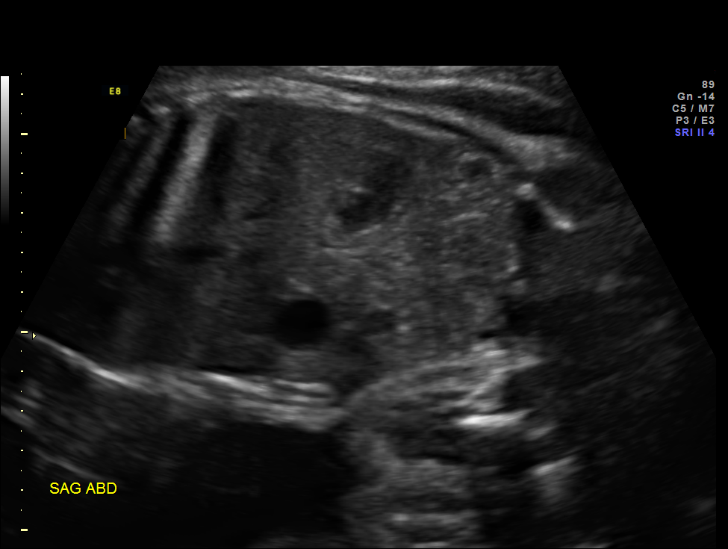
[im 26/32]
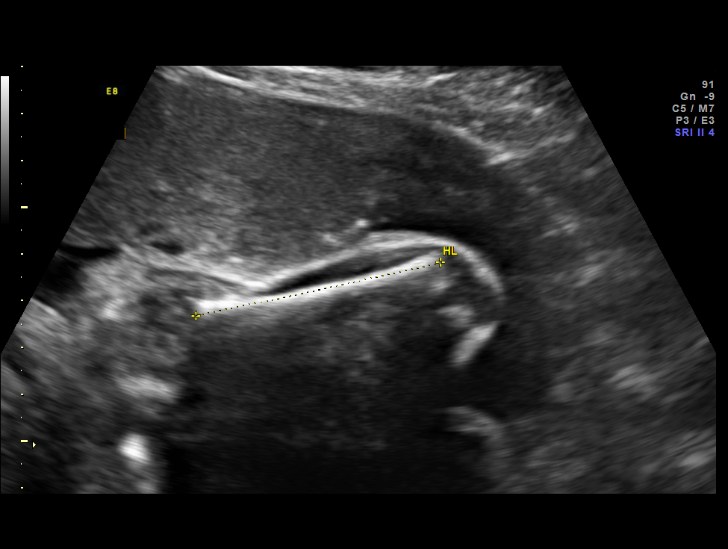
[im 28/32]
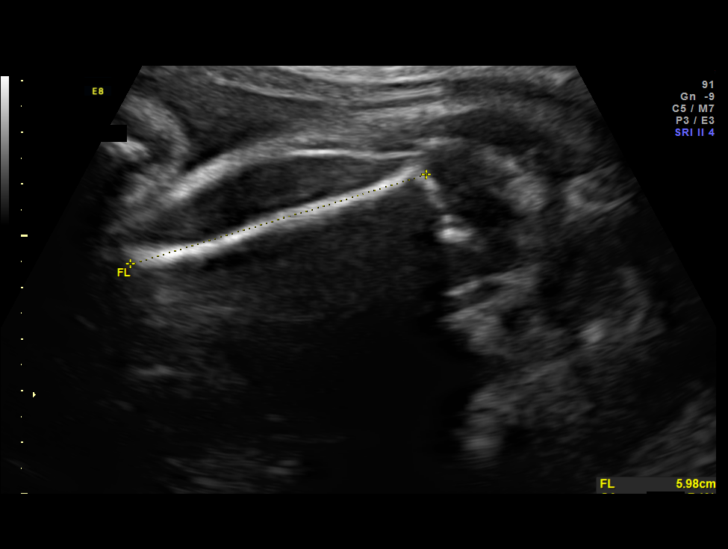
[im 30/32]
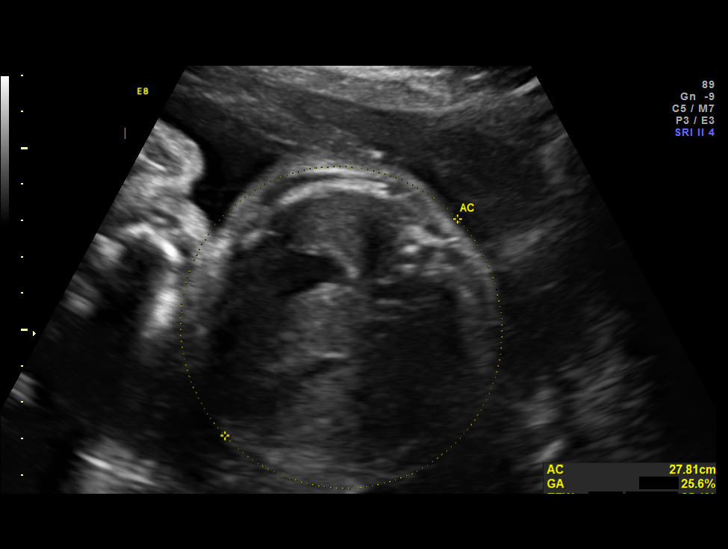

[12 of 28 positions shown; findings below may reference images not displayed]

FINDINGS: 1. Single intrauterine pregnancy.
 2. Estimated fetal weight is in the 25th%. Again, the head
 circumference is in the <3rd%.
 3. Anterior placenta without evidence of previa.
 4. Normal amniotic fluid index.
 5.The limited anatomy survey is normal.
Recommendations

 1. Overall appropriate fetal growth.
 2. Microcephaly:
 - previously counseled
 - head circumference is at 2 SD below the mean
 - negative TORCH work up
 - normal cell free fetal DNA
 - discussed association with increased risk of Dom
 disability but likely normal variant
 - intracranial structure appear normal
 3. Previous finding of echogenic bowel:
 - previously counseled
 - work up as above
 - cystic fibrosis screen negative
 - recommend follow fetal growth every 4-6 weeks until
 delivery.

 questions or concerns.

## 2016-04-28 IMAGING — US US FETAL BPP W/O NONSTRESS
1 series · 14 of 14 positions shown · non-contrast
Comparison: none

[Series 1: us fetal bpp w/o nonstress · non-contrast · 14 acquisitions, 14 frames shown]
[im 1/14]
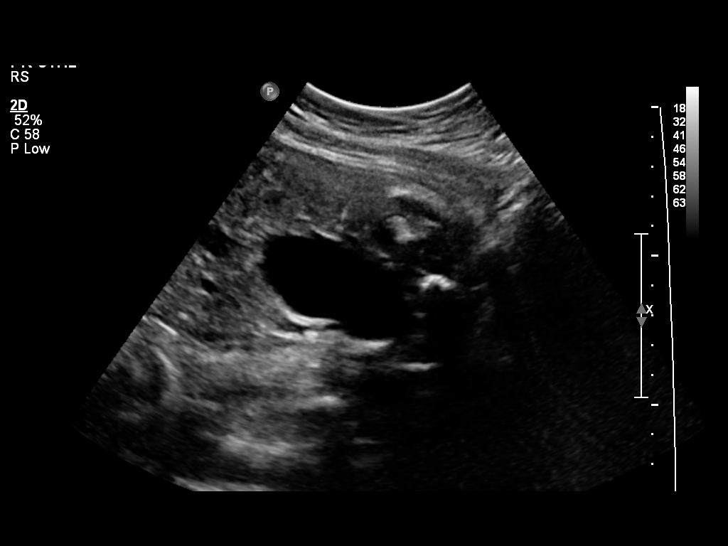
[im 2/14]
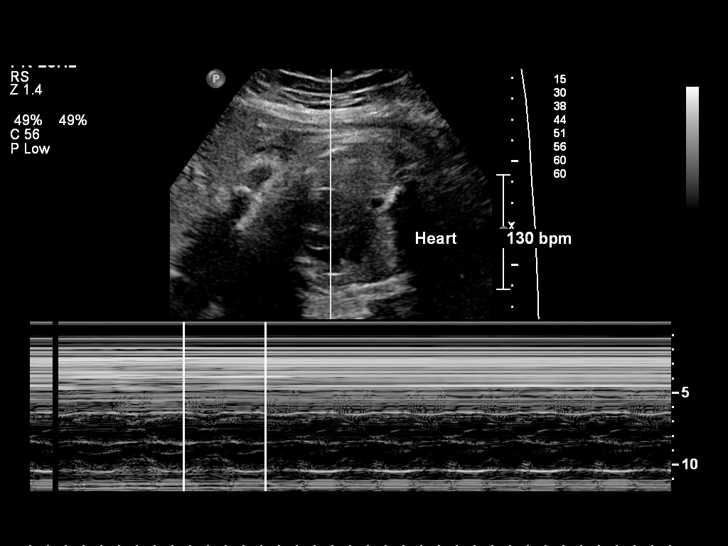
[im 3/14]
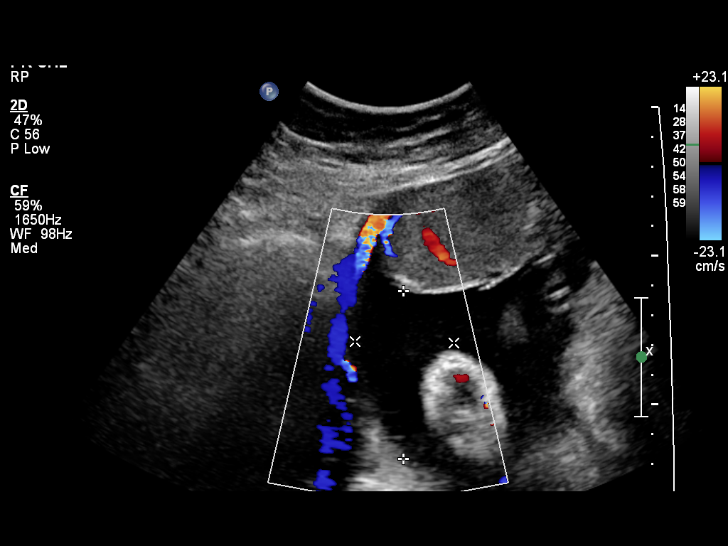
[im 4/14]
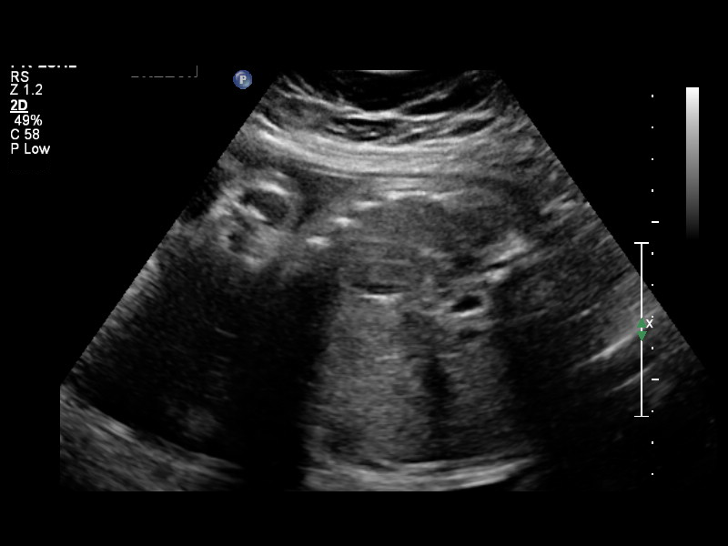
[im 5/14]
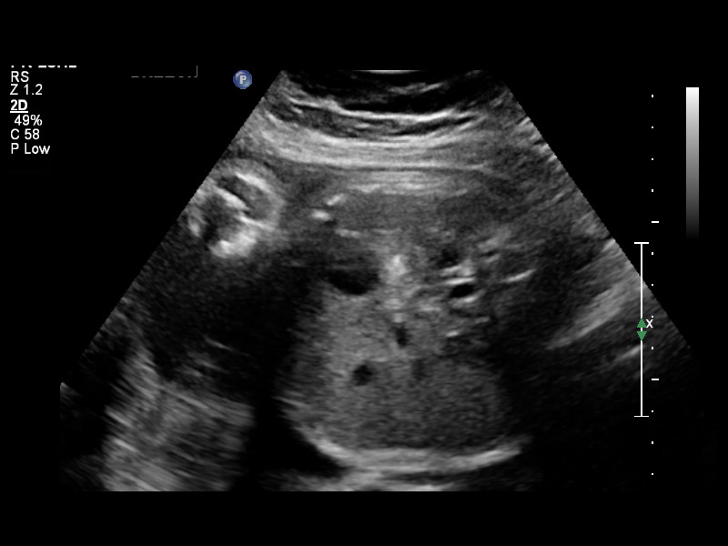
[im 6/14]
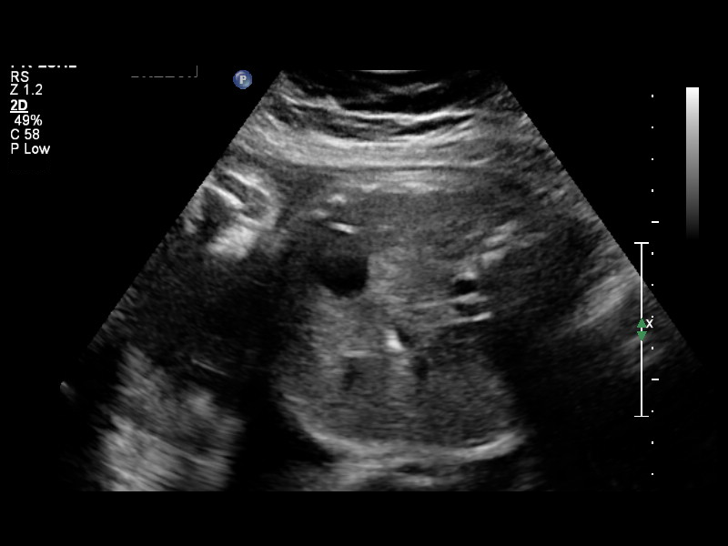
[im 7/14]
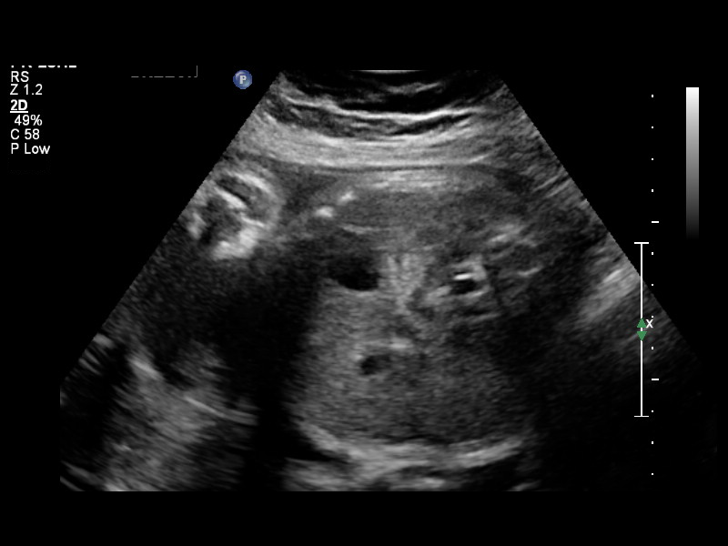
[im 8/14]
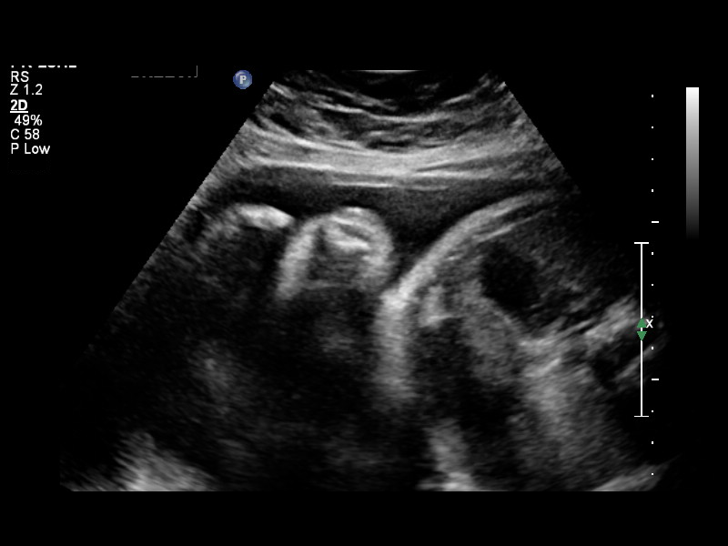
[im 9/14]
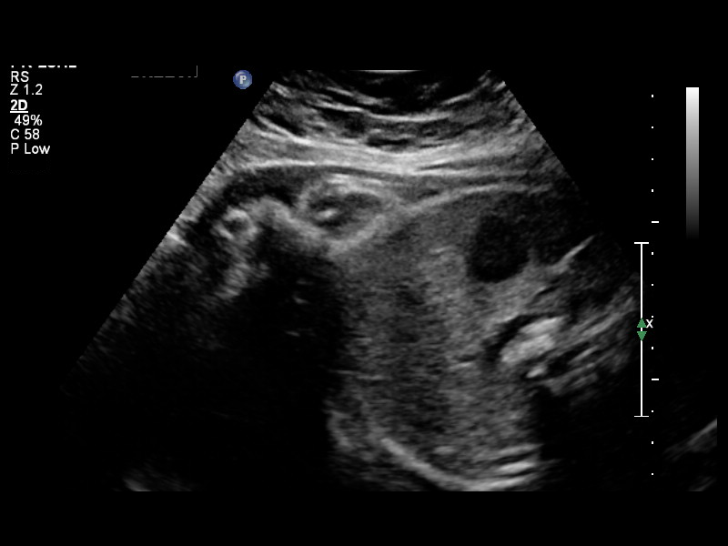
[im 10/14]
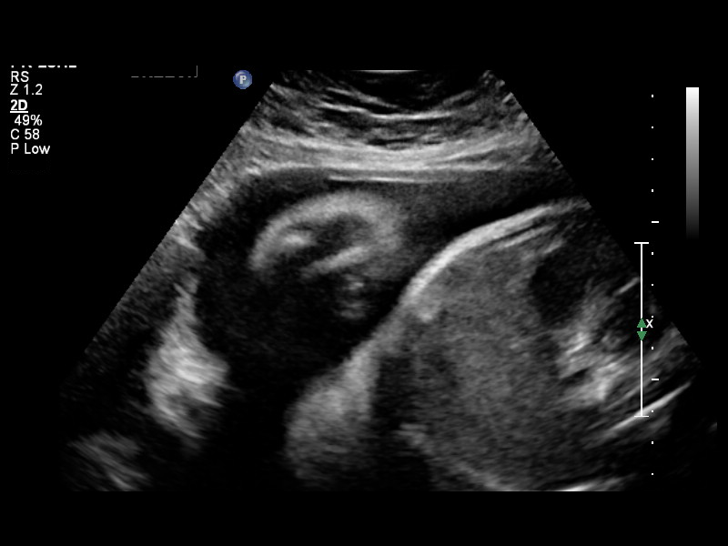
[im 11/14]
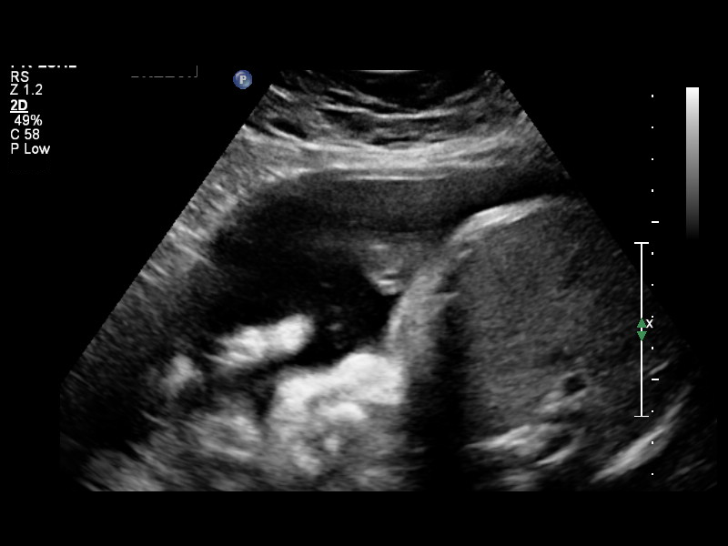
[im 12/14]
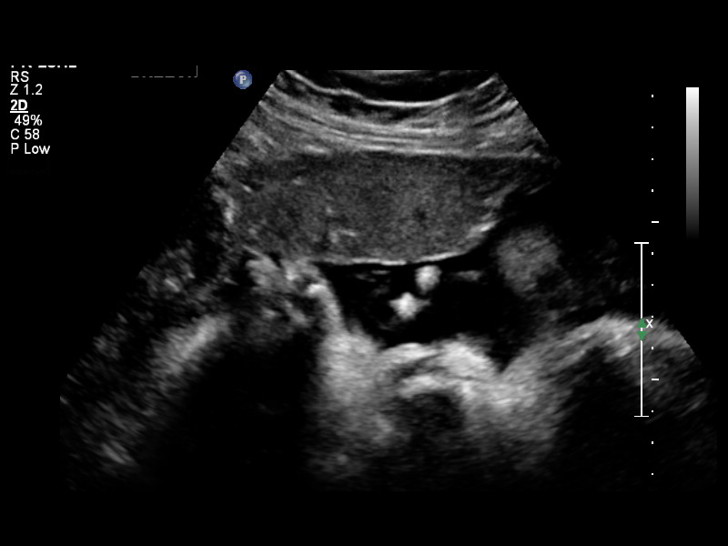
[im 13/14]
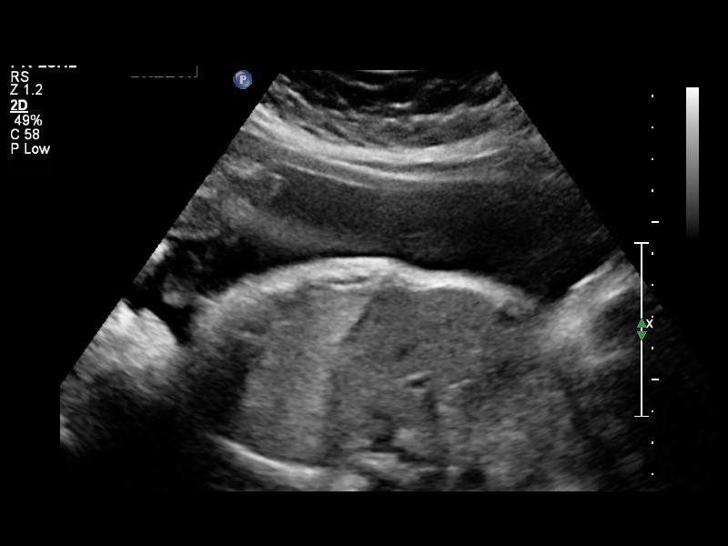
[im 14/14]
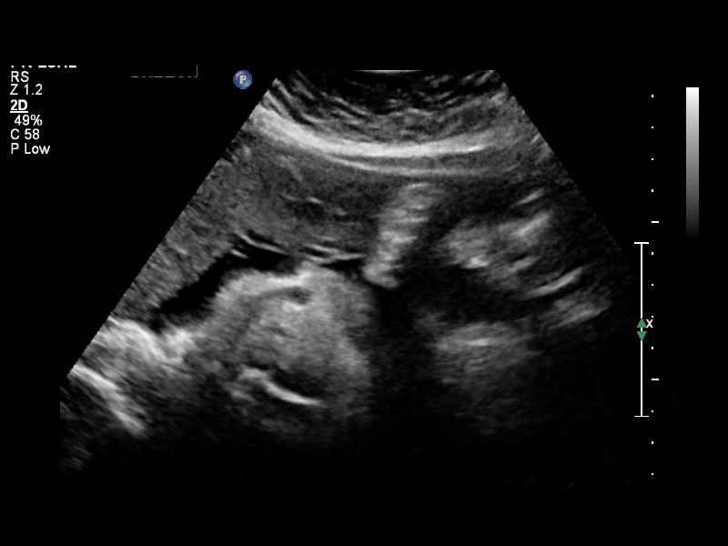

[14 of 14 positions shown; findings below may reference images not displayed]

OBSTETRICS REPORT
                      (Signed Final 03/29/2014 [DATE])

Service(s) Provided

Indications

 Non-reactive NST
Fetal Evaluation

 Num Of Fetuses:    1
 Fetal Heart Rate:  130                          bpm
 Cardiac Activity:  Observed
 Presentation:      Breech

 Comment:    BPP [DATE] in 6 minutes

 Amniotic Fluid
 AFI FV:      Subjectively within normal limits
                                             Larg Pckt:    5.62  cm
Biophysical Evaluation

 Amniotic F.V:   Pocket => 2 cm two         F. Tone:        Observed
                 planes
 F. Movement:    Observed                   Score:          [DATE]
 F. Breathing:   Observed
Gestational Age

 LMP:           34w 5d        Date:  07/29/13                 EDD:   05/05/14
 Best:          34w 5d     Det. By:  LMP  (07/29/13)          EDD:   05/05/14
Impression

 Single IUP at 34w 5d
 Active fetus with BPP of [DATE]
 Normal amniotic fluid volume
Recommendations

 Follow-up ultrasounds as clinically indicated.

 questions or concerns.
